# Patient Record
Sex: Male | Born: 1990 | Hispanic: Yes | Marital: Single | Smoking: Former smoker
Health system: Southern US, Community
[De-identification: ages and names within clinical notes are randomized; demographics above are authoritative.]

## PROBLEM LIST (undated history)

## (undated) HISTORY — PX: EYE SURGERY: SHX253

---

## 2012-06-06 ENCOUNTER — Emergency Department (HOSPITAL_COMMUNITY)
Admission: EM | Admit: 2012-06-06 | Discharge: 2012-06-06 | Disposition: A | Discharge status: Home / Self Care | Payer: Self-pay | Attending: Emergency Medicine | Admitting: Emergency Medicine

## 2012-06-06 ENCOUNTER — Encounter (HOSPITAL_COMMUNITY): Payer: Self-pay | Admitting: Family Medicine

## 2012-06-06 DIAGNOSIS — L723 Sebaceous cyst: Secondary | ICD-10-CM | POA: Insufficient documentation

## 2012-06-06 NOTE — ED Notes (Signed)
Pt has possible abscess under right arm. sts red and swollen. Hx of same a few years ago.

## 2012-06-06 NOTE — ED Provider Notes (Signed)
History     CSN: [id number]  Arrival date & time 06/06/12  1191   First MD Initiated Contact with Patient 06/06/12 2005      Chief Complaint  Patient presents with  . Abscess   HPI  History provided by the patient and friend. Patient is a 22 year old Hispanic male who presents with pain and swelling of the right axilla. Patient reports having similar symptoms in the past after an insect bite. Patient had an I&D performed in symptoms seem to be improved but she states she has always had a persistent small not to the area. This has never caused pain or discomfort. Last night he began having pain and redness to the area with increased swelling. Pain is worse with movements or palpation. Patient is not using treatment for his symptoms. He denies any other aggravating or alleviating factors. Denies any other associated symptoms. No fever, chills or sweats the      History reviewed. No pertinent past medical history.  History reviewed. No pertinent past surgical history.  History reviewed. No pertinent family history.  History  Substance Use Topics  . Smoking status: Never Smoker   . Smokeless tobacco: Not on file  . Alcohol Use: No      Review of Systems  Constitutional: Negative for fever and chills.  All other systems reviewed and are negative.    Allergies  Review of patient's allergies indicates no known allergies.  Home Medications  No current outpatient prescriptions on file.  BP 128/81  Pulse 69  Temp 98.1 F (36.7 C) (Oral)  Resp 18  SpO2 100%  Physical Exam  Nursing note and vitals reviewed. Constitutional: He is oriented to person, place, and time. He appears well-developed and well-nourished. No distress.  HENT:  Head: Normocephalic.  Cardiovascular: Normal rate and regular rhythm.   Pulmonary/Chest: Effort normal and breath sounds normal.  Neurological: He is alert and oriented to person, place, and time.  Skin: Skin is warm.       2-3 cm tender  nodule to the right axilla with erythema of the skin. There is some fluctuance. Mild induration of the skin.  Psychiatric: He has a normal mood and affect. His behavior is normal.    ED Course  Procedures  INCISION AND DRAINAGE Performed by: Angus Seller Consent: Verbal consent obtained. Risks and benefits: risks, benefits and alternatives were discussed Type: abscess. Bedside ultrasound was used for evaluation.   Body area: Right axilla  Anesthesia: local infiltration  Incision was made with a scalpel.  Local anesthetic: lidocaine 2% with epinephrine  Anesthetic total: 3 ml  Complexity: complex Blunt dissection to break up loculations.after incision was found patient had an infected sebaceous cyst. The external capsule was fragile and most of it was removed with blunt dissection.   Drainage: purulent  Drainage amount: Small   Packing material: None   Patient tolerance: Patient tolerated the procedure well with no immediate complications.        1. Infected sebaceous cyst       MDM  8:45 PM patient seen and evaluated. Patient appears well in no acute distress. He does not appear severely ill or toxic.        Angus Seller, [place] 06/06/12 2128

## 2012-06-06 NOTE — ED Provider Notes (Signed)
Medical screening examination/treatment/procedure(s) were performed by non-physician practitioner and as supervising physician I was immediately available for consultation/collaboration.  Juliet Rude. Rubin Payor, MD 06/06/12 2351

## 2012-06-06 NOTE — ED Notes (Signed)
PA at bedside.

## 2012-09-01 ENCOUNTER — Emergency Department (HOSPITAL_COMMUNITY): Payer: Self-pay

## 2012-09-01 ENCOUNTER — Emergency Department (HOSPITAL_COMMUNITY)
Admission: EM | Admit: 2012-09-01 | Discharge: 2012-09-02 | Disposition: A | Discharge status: Home / Self Care | Payer: Self-pay | Attending: Emergency Medicine | Admitting: Emergency Medicine

## 2012-09-01 ENCOUNTER — Encounter (HOSPITAL_COMMUNITY): Payer: Self-pay | Admitting: Emergency Medicine

## 2012-09-01 DIAGNOSIS — Z23 Encounter for immunization: Secondary | ICD-10-CM | POA: Insufficient documentation

## 2012-09-01 DIAGNOSIS — W268XXA Contact with other sharp object(s), not elsewhere classified, initial encounter: Secondary | ICD-10-CM | POA: Insufficient documentation

## 2012-09-01 DIAGNOSIS — Y990 Civilian activity done for income or pay: Secondary | ICD-10-CM | POA: Insufficient documentation

## 2012-09-01 DIAGNOSIS — Y939 Activity, unspecified: Secondary | ICD-10-CM | POA: Insufficient documentation

## 2012-09-01 DIAGNOSIS — S91331A Puncture wound without foreign body, right foot, initial encounter: Secondary | ICD-10-CM

## 2012-09-01 DIAGNOSIS — F17200 Nicotine dependence, unspecified, uncomplicated: Secondary | ICD-10-CM | POA: Insufficient documentation

## 2012-09-01 DIAGNOSIS — Y9269 Other specified industrial and construction area as the place of occurrence of the external cause: Secondary | ICD-10-CM | POA: Insufficient documentation

## 2012-09-01 DIAGNOSIS — S91309A Unspecified open wound, unspecified foot, initial encounter: Secondary | ICD-10-CM | POA: Insufficient documentation

## 2012-09-01 MED ORDER — TETANUS-DIPHTH-ACELL PERTUSSIS 5-2.5-18.5 LF-MCG/0.5 IM SUSP
0.5000 mL | Freq: Once | INTRAMUSCULAR | Status: AC
  Administered 2012-09-02: 0.5 mL via INTRAMUSCULAR
  Filled 2012-09-01: qty 0.5

## 2012-09-01 NOTE — ED Notes (Signed)
PT. ACCIDENTALLY STEPPED ON A NAIL AT RIGHT FOOT THIS AFTERNOON . NO TETANUS IMMUNIZATION . NO BLEEDING .

## 2012-09-01 NOTE — ED Provider Notes (Signed)
History    This chart was scribed for non-physician practitioner working with Olivia Mackie, MD by Leone Payor, ED Scribe. This patient was seen in room TR08C/TR08C and the patient's care was started at 2113.   CSN: [id number]  Arrival date & time 09/01/12  2113   None     Chief Complaint  Patient presents with  . Foot Injury     The history is provided by the patient. No language interpreter was used.    Frederick Caldwell is a 22 y.o. male who presents to the Emergency Department complaining of a new puncture wound to the R foot after stepping on a nail. Pt's tetanus is not UTD. He does not have any bleeding currently. Pt was at work when the injury occurred through the shoe.   Pt is a current everyday smoker and occasional alcohol user.  History reviewed. No pertinent past medical history.  History reviewed. No pertinent past surgical history.  No family history on file.  History  Substance Use Topics  . Smoking status: Current Every Day Smoker  . Smokeless tobacco: Not on file  . Alcohol Use: Yes      Review of Systems  Constitutional: Negative for fever.  Respiratory: Negative for shortness of breath.   Skin: Positive for wound. Negative for rash.    Allergies  Review of patient's allergies indicates no known allergies.  Home Medications  No current outpatient prescriptions on file.  BP 108/58  Pulse 81  Temp(Src) 98.4 F (36.9 C) (Oral)  Resp 14  SpO2 99%  Physical Exam  Nursing note and vitals reviewed. Constitutional: He appears well-developed and well-nourished. No distress.  HENT:  Head: Normocephalic and atraumatic.  Eyes: Conjunctivae and EOM are normal.  Neck: Normal range of motion. Neck supple.  Cardiovascular:  Intact distal pulses, capillary refill < 3 seconds  Musculoskeletal:  All other extremities with normal ROM  Neurological:  No sensory deficit  Skin: He is not diaphoretic.  No palpable foreign body.  Small puncture wound  not currently bleeding located on plantar surface of right foot    ED Course  Procedures (including critical care time)  DIAGNOSTIC STUDIES: Oxygen Saturation is 99% on room air, normal by my interpretation.    COORDINATION OF CARE: 11:57 PM-Discussed treatment plan with pt at bedside and pt agreed to plan.  Pt is informed that the pain will get better but then will worsen after 2 days. Pt is advised to come back if there is swelling, drainage of pus, redness/red stripes, warmth, fever.   Labs Reviewed - No data to display Dg Foot Complete Right  09/01/2012  *RADIOLOGY REPORT*  Clinical Data: Pain post trauma  RIGHT FOOT COMPLETE - 3+ VIEW  Comparison: None.  Findings:  Frontal, oblique, lateral views were obtained.  There is no fracture or dislocation.  There is a small erosion in the first MTP joint medially.  Joint spaces elsewhere appear intact.  No erosive change.  No radiopaque foreign body.  IMPRESSION: Small erosion along the medial aspect of the first MTP joint.  No other abnormality noted.   Original Report Authenticated By: Bretta Bang, M.D.      No diagnosis found.    MDM  Puncture wound  Patient presents to the ER for a puncture wound of his foot. Reports  a nail going through his shoe. Bleeding contained. Neurovascularly intact & no foreign bodies on exam. Imaging reviewed, no signs of bone or joint penetration. Tetanus was not up-to-date so  given in the emergency department.  Patient will be discharged on antibiotic with pseudomonas coverage.  Strict return precautions for signs of infection discussed. Advised non-wt bearing, elevated & very close follow up for signs of infection.  Patient verbalizes understanding.     I personally performed the services described in this documentation, which was scribed in my presence. The recorded information has been reviewed and is accurate.     Jaci Carrel, [place] 09/02/12 [phone]

## 2012-09-02 MED ORDER — HYDROCODONE-ACETAMINOPHEN 5-325 MG PO TABS: 1.0000 | ORAL_TABLET | Freq: Four times a day (QID) | ORAL | Status: DC | PRN

## 2012-09-02 MED ORDER — AMOXICILLIN-POT CLAVULANATE 875-125 MG PO TABS: 1.0000 | ORAL_TABLET | Freq: Two times a day (BID) | ORAL | Status: DC

## 2012-09-02 NOTE — ED Provider Notes (Signed)
Medical screening examination/treatment/procedure(s) were performed by non-physician practitioner and as supervising physician I was immediately available for consultation/collaboration.  Mackenzie Lia M Zondra Lawlor, MD 09/02/12 0646 

## 2014-05-01 ENCOUNTER — Encounter (HOSPITAL_COMMUNITY): Payer: Self-pay | Admitting: Emergency Medicine

## 2014-05-01 ENCOUNTER — Emergency Department (HOSPITAL_COMMUNITY)
Admission: EM | Admit: 2014-05-01 | Discharge: 2014-05-01 | Disposition: A | Discharge status: Home / Self Care | Payer: Self-pay | Attending: Emergency Medicine | Admitting: Emergency Medicine

## 2014-05-01 ENCOUNTER — Emergency Department (HOSPITAL_COMMUNITY): Payer: Self-pay

## 2014-05-01 DIAGNOSIS — Z79899 Other long term (current) drug therapy: Secondary | ICD-10-CM | POA: Insufficient documentation

## 2014-05-01 DIAGNOSIS — J069 Acute upper respiratory infection, unspecified: Secondary | ICD-10-CM | POA: Insufficient documentation

## 2014-05-01 DIAGNOSIS — Z87891 Personal history of nicotine dependence: Secondary | ICD-10-CM | POA: Insufficient documentation

## 2014-05-01 DIAGNOSIS — R059 Cough, unspecified: Secondary | ICD-10-CM

## 2014-05-01 DIAGNOSIS — R05 Cough: Secondary | ICD-10-CM

## 2014-05-01 DIAGNOSIS — Z792 Long term (current) use of antibiotics: Secondary | ICD-10-CM | POA: Insufficient documentation

## 2014-05-01 MED ORDER — GUAIFENESIN 100 MG/5ML PO SOLN: 5.0000 mL | ORAL | Status: DC | PRN

## 2014-05-01 MED ORDER — GUAIFENESIN-CODEINE 100-10 MG/5ML PO SOLN: 5.0000 mL | Freq: Three times a day (TID) | ORAL | Status: DC | PRN

## 2014-05-01 NOTE — ED Provider Notes (Signed)
CSN: [id number]     Arrival date & time 05/01/14  [id number] History  This chart was scribed for Santiago GladHeather Masen Salvas, PA-C, working with Audree CamelScott T Goldston, MD found by Elon SpannerGarrett Cook, ED Scribe. This patient was seen in room WTR9/WTR9 and the patient's care was started at 7:34 PM.   Chief Complaint  Patient presents with  . Cough   The history is provided by the patient. No language interpreter was used.    HPI Comments: Frederick Caldwell is a 23 y.o. male who presents to the Emergency Department complaining of a cough productive of yellowish sputum onset 6 weeks ago.  He reports the cough causes CP.  However, he does not have any chest pain at rest when he is not coughing.  Patient reports associated intermittent SOB and nasal congestion.  Patient has tried Mucinex, Zyrtec, and Claritin without relief.  Patient denies a history of smoking.   Patient denies sinus congestion, ear pain, fever, or chills.  History reviewed. No pertinent past medical history. Past Surgical History  Procedure Laterality Date  . Eye surgery     Family History  Problem Relation Age of Onset  . Blindness Mother   . Down syndrome Sister    History  Substance Use Topics  . Smoking status: Former Smoker    Quit date: 03/18/2014  . Smokeless tobacco: Not on file  . Alcohol Use: Yes     Comment: social    Review of Systems  Constitutional: Negative for fever.  HENT: Negative for congestion and ear pain.   Respiratory: Positive for cough.   Cardiovascular: Positive for chest pain.  All other systems reviewed and are negative.     Allergies  Review of patient's allergies indicates no known allergies.  Home Medications   Prior to Admission medications   Medication Sig Start Date End Date Taking? Authorizing Provider  amoxicillin-clavulanate (AUGMENTIN) 875-125 MG per tablet Take 1 tablet by mouth 2 (two) times daily. One po bid x 7 days 09/02/12   Jaci CarrelLisette Paz, PA-C  HYDROcodone-acetaminophen (NORCO/VICODIN)  5-325 MG per tablet Take 1 tablet by mouth every 6 (six) hours as needed for pain. 09/02/12   Lisette Paz, PA-C  Multiple Vitamin (MULTIVITAMIN WITH MINERALS) TABS Take 1 tablet by mouth daily.    Historical Provider, MD   BP 123/68 mmHg  Pulse 68  Temp(Src) 97.9 F (36.6 C) (Oral)  Resp 16  SpO2 100% Physical Exam  Constitutional: He is oriented to person, place, and time. He appears well-developed and well-nourished. No distress.  HENT:  Head: Normocephalic and atraumatic.  Mouth/Throat: Oropharynx is clear and moist.  TM's normal  Eyes: Conjunctivae and EOM are normal.  Neck: Neck supple. No tracheal deviation present.  Cardiovascular: Normal rate, regular rhythm and normal heart sounds.  Exam reveals no gallop and no friction rub.   No murmur heard. Pulmonary/Chest: Effort normal and breath sounds normal. No respiratory distress. He has no wheezes. He has no rales.  Musculoskeletal: Normal range of motion.  Neurological: He is alert and oriented to person, place, and time.  Skin: Skin is warm and dry.  Psychiatric: He has a normal mood and affect. His behavior is normal.  Nursing note and vitals reviewed.   ED Course  Procedures (including critical care time)  DIAGNOSTIC STUDIES: Oxygen Saturation is 100% on RA, normal by my interpretation.    COORDINATION OF CARE:  7:38 PM Will order CXR to r/o pneumonia.  Patient acknowledges and agrees with plan.    Labs  Review Labs Reviewed - No data to display  Imaging Review No results found.   EKG Interpretation None      MDM   Final diagnoses:  None   Pt CXR negative for acute infiltrate. Patients symptoms are consistent with URI, likely viral etiology. Discussed that antibiotics are not indicated for viral infections. Pt will be discharged with symptomatic treatment.  Verbalizes understanding and is agreeable with plan. Pt is hemodynamically stable & in NAD prior to dc.  Return precautions given.     Santiago GladHeather  Blakelee Allington, PA-C 05/04/14 [phone]  Audree CamelScott T Goldston, MD 05/04/14 [phone]

## 2014-05-01 NOTE — Discharge Instructions (Signed)

## 2014-05-01 NOTE — ED Notes (Signed)
Family states pt has had a cough for over a month and cannot get rid of it with OTC medication  Pt states for the past 2 days he is c/o chest soreness with cough  Pt states for the past few days the cough is productive with light yellow sputum with occasional greenish color

## 2014-05-13 ENCOUNTER — Emergency Department (HOSPITAL_COMMUNITY)
Admission: EM | Admit: 2014-05-13 | Discharge: 2014-05-13 | Disposition: A | Discharge status: Home / Self Care | Payer: Self-pay

## 2014-05-13 ENCOUNTER — Encounter (HOSPITAL_COMMUNITY): Payer: Self-pay | Admitting: Emergency Medicine

## 2014-05-13 ENCOUNTER — Emergency Department (HOSPITAL_COMMUNITY)
Admission: EM | Admit: 2014-05-13 | Discharge: 2014-05-13 | Disposition: A | Discharge status: Home / Self Care | Payer: Self-pay | Attending: Emergency Medicine | Admitting: Emergency Medicine

## 2014-05-13 DIAGNOSIS — Z79899 Other long term (current) drug therapy: Secondary | ICD-10-CM | POA: Insufficient documentation

## 2014-05-13 DIAGNOSIS — Y998 Other external cause status: Secondary | ICD-10-CM | POA: Insufficient documentation

## 2014-05-13 DIAGNOSIS — L309 Dermatitis, unspecified: Secondary | ICD-10-CM | POA: Insufficient documentation

## 2014-05-13 DIAGNOSIS — Z792 Long term (current) use of antibiotics: Secondary | ICD-10-CM | POA: Insufficient documentation

## 2014-05-13 DIAGNOSIS — Y9289 Other specified places as the place of occurrence of the external cause: Secondary | ICD-10-CM | POA: Insufficient documentation

## 2014-05-13 DIAGNOSIS — Z87891 Personal history of nicotine dependence: Secondary | ICD-10-CM | POA: Insufficient documentation

## 2014-05-13 DIAGNOSIS — S39012A Strain of muscle, fascia and tendon of lower back, initial encounter: Secondary | ICD-10-CM | POA: Insufficient documentation

## 2014-05-13 DIAGNOSIS — Z7952 Long term (current) use of systemic steroids: Secondary | ICD-10-CM | POA: Insufficient documentation

## 2014-05-13 DIAGNOSIS — Y9389 Activity, other specified: Secondary | ICD-10-CM | POA: Insufficient documentation

## 2014-05-13 DIAGNOSIS — X58XXXA Exposure to other specified factors, initial encounter: Secondary | ICD-10-CM | POA: Insufficient documentation

## 2014-05-13 MED ORDER — HYDROCORTISONE 1 % EX CREA: TOPICAL_CREAM | CUTANEOUS | Status: DC

## 2014-05-13 MED ORDER — TRAMADOL HCL 50 MG PO TABS: 50.0000 mg | ORAL_TABLET | Freq: Four times a day (QID) | ORAL | Status: DC | PRN

## 2014-05-13 MED ORDER — CYCLOBENZAPRINE HCL 10 MG PO TABS: 10.0000 mg | ORAL_TABLET | Freq: Two times a day (BID) | ORAL | Status: DC | PRN

## 2014-05-13 NOTE — ED Notes (Signed)
Pt's wife states that patient also has rash on left leg.

## 2014-05-13 NOTE — ED Notes (Signed)
Pt called twice and did not answer

## 2014-05-13 NOTE — ED Notes (Signed)
No answer when called 

## 2014-05-13 NOTE — ED Notes (Signed)
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

## 2014-05-13 NOTE — ED Provider Notes (Signed)
CSN: [id number]     Arrival date & time 05/13/14  1711 History   This chart was scribed for non-physician practitioner Marlon Peliffany Daya Dutt, PA-C working Mirian MoMatthew Gentry, MD by Evon Slackerrance Branch, ED Scribe. This patient was seen in room WTR7/WTR7 and the patient's care was started at 7:11 PM.    Chief Complaint  Patient presents with  . Back Pain    lower  . Shoulder Pain    right  . Rash    leg   The history is provided by the patient. No language interpreter was used.   HPI Comments: Frederick Caldwell is a 23 y.o. male who presents to the Emergency Department complaining of right shoulder pain onset 1 day prior. Pt is also complaining of back pain as well. 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 PT denies taking any medications PTA. Pt denies gait problem, urinary symptoms   History reviewed. No pertinent past medical history. Past Surgical History  Procedure Laterality Date  . Eye surgery     Family History  Problem Relation Age of Onset  . Blindness Mother   . Down syndrome Sister    History  Substance Use Topics  . Smoking status: Former Smoker    Quit date: 03/18/2014  . Smokeless tobacco: Not on file  . Alcohol Use: Yes     Comment: social    Review of Systems  Musculoskeletal: Positive for back pain.  All other systems reviewed and are negative.     Allergies  Review of patient's allergies indicates no known allergies.  Home Medications   Prior to Admission medications   Medication Sig Start Date End Date Taking? Authorizing Provider  amoxicillin-clavulanate (AUGMENTIN) 875-125 MG per tablet Take 1 tablet by mouth 2 (two) times daily. One po bid x 7 days Patient not taking: Reported on 05/13/2014 09/02/12   Lisette Paz, PA-C  cyclobenzaprine (FLEXERIL) 10 MG tablet Take 1 tablet (10 mg total)  by mouth 2 (two) times daily as needed for muscle spasms. 05/13/14   Jo-Ann Johanning Irine SealG Avonna Iribe, PA-C  guaiFENesin-codeine 100-10 MG/5ML syrup Take 5 mLs by mouth 3 (three) times daily as needed for cough. Patient not taking: Reported on 05/13/2014 05/01/14   Santiago GladHeather Laisure, PA-C  HYDROcodone-acetaminophen (NORCO/VICODIN) 5-325 MG per tablet Take 1 tablet by mouth every 6 (six) hours as needed for pain. Patient not taking: Reported on 05/13/2014 09/02/12   Jaci CarrelLisette Paz, PA-C  hydrocortisone cream 1 % Apply to affected area 2 times daily 05/13/14   Dorthula Matasiffany G Deborha Moseley, PA-C  traMADol (ULTRAM) 50 MG tablet Take 1 tablet (50 mg total) by mouth every 6 (six) hours as needed. 05/13/14   Dorthula Matasiffany G Areebah Meinders, PA-C   Triage Vitals: BP 117/68 mmHg  Pulse 69  Temp(Src) 97.9 F (36.6 C) (Oral)  Resp 17  SpO2 100%  Physical Exam  Constitutional: He is oriented to person, place, and time. He appears well-developed and well-nourished. No distress.  HENT:  Head: Normocephalic and atraumatic.  Eyes: Conjunctivae and EOM are normal.  Neck: Neck supple. No tracheal deviation present.  Cardiovascular: Normal rate.   Pulmonary/Chest: Effort normal. No respiratory distress.  Musculoskeletal: Normal range of motion.       Back:  Pt has equal strength to bilateral lower extremities.  Neurosensory function adequate to both legs  No clonus on dorsiflextion Skin color is normal. Skin is warm and moist.  I see no step off deformity, no midline bony tenderness.  Pt is able to ambulate.  No crepitus, laceration, effusion, induration, lesions, swelling.   Pedal pulses are symmetrical and palpable bilaterally  Bilateral lower tenderness to palpation of paraspinel muscles   Neurological: He is alert and oriented to person, place, and time.  Skin: Skin is warm and dry.  3x4 area of eczema to the right shoulder.   Psychiatric: He has a normal mood and affect. His behavior is normal.  Nursing note and vitals reviewed.   ED  Course  Procedures (including critical care time) DIAGNOSTIC STUDIES: Oxygen Saturation is 100% on RA, normal by my interpretation.    COORDINATION OF CARE:    Labs Review Labs Reviewed - No data to display  Imaging Review No results found.   EKG Interpretation None      MDM   Final diagnoses:  Eczema  Low back strain, initial encounter   23 y.o.Frederick Caldwell's  with back pain. No neurological deficits and normal neuro exam. Patient can walk. No loss of bowel or bladder control. No concern for cauda equina at this time base on HPI and physical exam findings. No fever, night sweats, weight loss, h/o cancer, IVDU.   RICE protocol and pain medicine indicated and discussed with patient.   Patient Plan 1. Medications: pain medication and muscle relaxer. Cont usual home medications unless otherwise directed. 2. Treatment: rest, drink plenty of fluids, gentle stretching as discussed, alternate ice and heat  3. Follow Up: Please followup with your primary doctor for discussion of your diagnoses and further evaluation after today's visit; if you do not have a primary care doctor use the resource guide provided to find one  Advised to follow-up with the orthopedist if symptoms do not start to resolve in the next 2-3 days. If develop loss of bowel or urinary control return to the ED as soon as possible for further evaluation. To take the medications as prescribed as they can cause harm if not taken appropriately.   Vital signs are stable at discharge. Filed Vitals:   05/13/14 1724  BP: 117/68  Pulse: 69  Temp: 97.9 F (36.6 C)  Resp: 17    Patient/guardian has voiced understanding and agreed to follow-up with the PCP or specialist.   I personally performed the services described in this documentation, which was scribed in my presence. The recorded information has been reviewed and is accurate.       Dorthula Matasiffany G Sharelle Burditt, PA-C 05/13/14 1952  Mirian MoMatthew Gentry,  MD 05/20/14 [phone]

## 2014-05-13 NOTE — Discharge Instructions (Signed)
Traumatismos acromioclaviculares (Acromioclavicular Injuries) La articulacin AC (acromioclavicular) es la articulacin del hombro en donde la clavcula se une con el omplato (escpula). La parte del omplato que se conecta a la clavcula se denomina acromion. Es por esto que se denomina articulacin AC (acromio-clavicular). Abajo se detallan los problemas ms comunes y los tratamientos para la articulacin AC. ARTRITIS La artritis se produce cuando la articulacin se ha lesionado y Charity fundraiser (la almohadilla blanda que se encuentra entre las articulaciones) se destruye. Este es el desgaste que se observa en la mayor parte de las articulaciones del organismo si han sido usadas intensamente y [place]. Esto hace que la articulacin duela y se hinche, lo que empeora con el uso. SEPARACIN DE LA ARTICULACIN AC La separacin de la articulacin AC implica que los ligamentos que conectan el acromion con el omplato y la clavcula han sido daados, y ambos huesos no estn alineados. La separacin [employer] puede ser [employer] grave, y el "grado" depende de si el ligamento se ha desgarrado y Barista de su deterioro.  [place] I - La lesin es la ms leve, y la articulacin AC est en Chief of Staff.  [place] II - Se produce una lesin en los ligamentos que refuerzan la articulacin AC. En este tipo de lesin, los ligamentos estn slo distendidos y no completamente desgarrados. Cuando se tensiona, la articulacin AC se vuelve dolorosa e inestable.  Ovid articulacin AC y los ligamentos secundarios estn completamente desgarrados y la clavcula ya no est unida al omplato. Esto produce una deformidad. FRACTURA DE LA ARTICULACIN AC La fractura de la articulacin AC significa que hubo una ruptura de los huesos de esa articulacin. TRATAMIENTO TRATAMIENTO DE LA ARTRITIS AC  Actualmente no hay forma de reemplazar el cartlago lesionado por la artritis. La mejor forma de  mejorar el trastorno es disminuir las actividades que agravan el problema. La aplicacin de hielo en las articulaciones ayuda a Therapist, occupational y la inflamacin [place]).  Si las medidas menos conservadoras no dan resultado, se pueden Risk manager las inyecciones de corticoides. Estos son antiinflamatorios. Disminuyen el dolor y la hinchazn en la articulacin.  Si las medidas no quirrgicas fracasan, se puede [employer]. El tratamiento puede consistir en la remocin de la porcin final de la clavcula. Esta es la parte de la clavcula ms cercana al hombro que habitualmente est fijada con ligamentos al acromion del omplato. Esta ciruga puede realizarse mediante artroscopa (un instrumento similar a un lpiz telescpico para Chemical engineer interior de Water engineer) o puede realizarse como ciruga abierta a travs de una pequea incisin (corte realizado por un cirujano). La mayora de los pacientes tendr una buena amplitud de movimientos en seis semanas, y Geologist, engineering a Data processing manager en alrededor de 12 semanas, salvo que existan complicaciones. TRATAMIENTO DE LA SEPARACIN AC  El tratramiento inicial se realiza para Therapist, occupational. Esto se logra inmovilizando el brazo en un cabestrillo y colocando una bolsa con hielo en el hombro durante 30 a 30 minutos cada dos horas segn la necesidad. A medida que el dolor disminuye, es importante comenzar a [employer] dedos, la [place], el codo y finalmente el hombro para prevenir la rigidez o "congelamiento" del hombro. Las instrucciones acerca de cundo y Chiropractor hombro se las dar el profesional que lo asiste. La cantidad de [employer] se necesita para recuperar todo el movimiento y las funciones depende del tamao o el Clarita de  la lesin. La recuperacin de una separacin AC de [place] I, generalmente demora entre 10 y [address], mientras que la de [place] III puede llevarle entre seis y Public house manager, lo mismo que demora la curacin de un hueso  fracturado.  [place] I y II generalmente no requieren [place]. Frederica III permiten regresar totalmente a las actividades con Lawyer. El tratamiento tambin depende de la profesin del [place]. Por ejemplo un jugador de ftbol de alto nivel con una lesin en el brazo que lanza, recibir un tratamiento ms enrgico que alguien que trabaja en un escritorio y que rara vez utiliza el brazo para actividades intensas. En algunos casos, puede presentarse un bulto doloroso que persiste y que puede requerir [place] en una etapa posterior. La ciruga puede ser muy exitosa, pero los beneficios deben evaluarse teniendo en cuenta los riesgos potenciales. TRATAMIENTO DE LA FRACTURA AC El tratamiento de fractura depende del tipo de fractura. En algunos casos, una tablilla o un cabestrillo ser todo lo que necesite. Otras veces puede ser necesaria la [place]. El profesional que lo asiste lo ayudar con estas decisiones y podr decidir junto a usted cul ser el mejor tratamiento para usted. INSTRUCCIONES PARA EL CUIDADO DOMICILIARIO  Aplique hielo sobre la lesin durante 15 a 20 minutos por hora mientras se encuentre despierto, durante 2 das. Ponga el hielo en una bolsa plstica y coloque una toalla entre la bolsa y la piel.  Si le han colocado un cabestrillo, selo durante el tiempo que se lo indique su mdico, incluso de noche. El cabestrillo o la tablilla podrn retirarse para darse un bao o una ducha, o segn las indicaciones. Asegrese de Radio producer, tal como cuando le colocaron el cabestrillo. No levante el brazo.  Si le han colocado un cabestrillo en ocho, debe hacer que otra persona se lo ajuste suavemente [employer]. Ajstela lo suficiente como para [employer] hombros contenidos. Deje el espacio suficiente como para que pueda introducir el dedo ndice entre el cuerpo y Geneticist, molecular. Afljelo inmediatamente si siente  adormecimiento u hormigueo en las manos.  Utilice los medicamentos de venta libre o de prescripcin para Conservation officer, historic buildings, Health and safety inspector o la [place], segn se lo indique el profesional que lo asiste.  Si a usted o a Oceanographer han dado turno para Health visitor, es muy importante que concurra para Product/process development scientist las complicaciones a largo plazo, el dolor crnico o la discapacidad. SOLICITE ATENCIN MDICA SI:  El dolor no se alivia con los [employer].  Aumenta la hinchazn o hay una decoloracin que Engineering geologist de Teacher, English as a foreign language.  Por algn motivo usted o su nio no pudieron Advertising account executive segn las indicaciones.  Hay un adormecimiento y hormigueo progresivos en el brazo, Management consultant o la [place]. SOLICITE ATENCIN MDICA DE INMEDIATO SI:  Siente el brazo adormecido, fro o plido.  Hay un aumento del [employer] mano, el antebrazo o los dedos. EST SEGURO QUE:  Comprende las instrucciones para el alta mdica.  Controlar su enfermedad.  Solicitar atencin mdica de inmediato segn las indicaciones. Document Released: 02/12/2005 Document Revised: 07/28/2011 [facility] Patient Information 2015 [place]. This information is not intended to replace advice given to you by your health care provider. Make sure you discuss any questions you have with your health care provider.   Dolor de espalda en el adulto (Back Pain, Adult)  El dolor de cintura es frecuente. Aproximadamente 1  de cada 5 personas lo sufren.La causa rara vez pone en peligro la vida. Con frecuencia mejora luego de algn tiempo.Alrededor de la mitad de las personas que sufren un inicio sbito de dolor de cintura, se sentirn mejor luego de 2 semanas. Aproximadamente 8 de cada 10 se sentirn mejor luego de 6 semanas.  CAUSAS  Algunas causas comunes son:   Distensin de los msculos o ligamentos que sostienen la columna vertebral.  Desgaste (degeneracin) de los discos vertebrales.  Artritis.  Traumatismos directos en  la espalda. DIAGNSTICO  La mayor parte de las veces, la causa directa no se conoce.Sin embargo, Conservation officer, historic buildings puede tratarse efectivamente an cuando no se Community education officer.Una de las formas ms precisas de asegurar que la causa del dolor no constituye un peligro es responder a las preguntas del mdico acerca de su salud y sus sntomas. Si el mdico necesita ms informacin, podr indicar anlisis de laboratorio o Optometrist un diagnstico por imgenes (radiografas o Health visitor).Sin embargo, aunque las [employer] modificaciones, generalmente no es necesaria la [place].  INSTRUCCIONES PARA EL CUIDADO EN EL HOGAR  En algunas personas, el dolor de espalda vuelve.Como rara vez es peligroso, los pacientes pueden aprender a Education administrator.   Mantngase activo. Si permanece sentado o de pie mucho tiempo en el mismo lugar, se tensiona la espalda.  No se siente, maneje ni se quede parado en un mismo lugar por ms de 30 minutos. Realice caminatas cortas en superficies planas ni bien el dolor haya cedido. Trate de Orthoptist tiempo que camina .  No se quede en la cama.Si hace reposo durante ms de 1 o 2 das, puede Geologist, engineering.  No evite los ejercicios ni el trabajo.El cuerpo est hecho para moverse.No es peligroso estar [place], aunque le duela la espalda.La espalda se curar ms rpido si contina sus actividades antes de que el dolor se vaya.  Preste atencin a su cuerpo cuando se incline y se levante. Muchas personas sienten menos molestias cuando levantan objetos si doblan las rodillas, mantienen la carga cerca del cuerpo y evitan torcerse. Generalmente, las posiciones ms cmodas son las que ejercen menos tensin en la espalda en recuperacin.  Encuentre una posicin cmoda para dormir. Utilice un colchn firme y recustese de [place]. Doble ligeramente sus rodillas. Si se recuesta sobre su espalda, coloque una almohada debajo de sus rodillas.  Tome slo  medicamentos de venta libre o recetados, segn las indicaciones del mdico. Los medicamentos de venta libre para Glass blower/designer y reducir Futures trader, son los que en general ms ayudan.El mdico podr prescribirle relajantes musculares.Estos medicamentos calman el dolor de modo que pueda retornar a sus actividades normales y a Marine scientist.  Aplique hielo sobre la zona lesionada.  Ponga el hielo en una bolsa plstica.  Colquese una toalla entre la piel y la bolsa de hielo.  Deje la bolsa de hielo durante 15 a 20 minutos 3 a 4 veces por da, durante los primeros 2  3 das. Luego podr alternar Lyndal Pulley calor y 22 para reducir Conservation officer, historic buildings y los espasmos.  Consulte a su mdico si puede tratar de hacer ejercicios para la espalda y recibir un masaje suave. Pueden ser beneficiosos.  Evite sentirse ansioso o estresado.El estrs aumenta la tensin muscular y puede empeorar el dolor de espalda.Es importante reconocer cuando est ansioso o estresado y aprender la forma de controlarlos.El ejercicio es una gran opcin. SOLICITE ATENCIN MDICA SI:   Siente un dolor que  no se alivia con reposo o medicamentos.  El dolor no mejora en 1 semana.  Desarrolla nuevos sntomas.  No se siente bien en general. SOLICITE ATENCIN MDICA DE INMEDIATO SI:  Siente un dolor que se irradia desde la espalda hacia sus piernas.  Desarrolla nuevos problemas en el intestino o la vejiga.  Siente debilidad o adormecimiento inusual en sus brazos o piernas.  Presenta nuseas o vmitos.  Presenta dolor abdominal.  Se siente desfalleciente. Document Released: 05/05/2005 Document Revised: 11/04/2011 [facility] Patient Information 2015 [place], [place]. This information is not intended to replace advice given to you by your health care provider. Make sure you discuss any questions you have with your health care provider.  Dermatitis de contacto (Contact Dermatitis) La dermatitis de contacto es  una reaccin a ciertas sustancias que tocan la piel. Puede ser Ardelia Mems dermatitis de contacto irritante o alrgica. La dermatitis de contacto irritante no requiere exposicin previa a la sustancia que provoc la reaccin.La dermatitis alrgica slo ocurre si ha estado expuesto anteriormente a la sustancia. Al repetir la exposicin, el organismo reacciona a la sustancia.  CAUSAS  Muchas sustancias pueden causar dermatitis de contacto. La dermatitis irritante se produce cuando hay exposicin repetida a sustancias levemente irritantes, como por ejemplo:   Maquillaje.  Jabones.  Detergentes.  Lavandina.  cidos.  Sales metlicas, como el nquel. Las causas de la dermatitis alrgica son:   Plantas venenosas.  Sustancias qumicas (desodorantes, champs).  Bijouterie.  Ltex.  Neomicina en cremas con triple antibitico.  Conservantes en productos incluyendo en la ropa. SNTOMAS  En la zona de la piel que ha estado expuesta puede haber:   Sequedad o descamacin.  Enrojecimiento.  Grietas.  Picazn.  Dolor o sensacin de ardor.  Ampollas. En el caso de la dermatitis de Risk manager, puede haber slo hinchazn en algunas zonas, como la boca o los genitales.  DIAGNSTICO  El mdico podr hacer el diagnstico realizando un examen fsico. En los casos en que la causa es incierta y se sospecha una dermatitis de [place], le har una prueba en la piel con un parche para determinar la causa de la dermatitis. TRATAMIENTO  El tratamiento incluye la proteccin de la piel de nuevos contactos con la sustancia irritante, evitando la sustancia en lo posible. Puede ser de utilidad colocar una barrera como cremas, polvos y [place]. El mdico tambin podr recomendar:   Cremas o pomadas con corticoides aplicadas 2 veces por da. Para un mejor efecto, humedezca la zona con agua fresca durante 20 minutos. Luego aplique el medicamento. Cubra la zona con un vendaje plstico. Puede almacenar la  crema con corticoides en el refrigerador para Research scientist (medical) "refrescante" sobre la erupcin que har aliviar la picazn. Esto aliviar la picazn. En los casos ms graves ser necesario aplicar corticoides por va oral.  Ungentos con antibiticos o antibacterianos, si hay una infeccin en la piel.  Antihistamnicos en forma de locin o por va oral para calmar la picazn.  Lubricantes para mantener la humectacin de la piel.  La solucin de [place] para reducir el enrojecimiento y Conservation officer, historic buildings o para secar una erupcin que supura. Mezcle un paquete o tableta en dos tazas de agua fra. Moje un pao limpio en la solucin, escrralo un poco y colquelo en el rea afectada. Djelo en el lugar durante 30 minutos. Repita el procedimiento todas las veces que pueda a lo largo del Training and development officer.  Hgase baos con almidn o bicarbonato todos los das si la zona es demasiado extensa  como para cubrirla con una toallita. Algunas sustancias qumicas, como los lcalis o los cidos pueden daar la piel del mismo modo que [place]. Enjuague la piel durante 15 a 20 minutos con agua fra despus de la exposicin a esas sustancias. Tambin busque atencin mdica de inmediato. En los casos de piel muy irritada, ser necesario aplicar (vendajes), antibiticos y analgsicos.  INSTRUCCIONES PARA EL CUIDADO EN EL HOGAR   Evite lo que ha causado la erupcin.  Mantenga el rea de la piel afectada sin contacto con el agua caliente, el jabn, la luz solar, las sustancias qumicas, sustancias cidas o todo lo que la irrite.  No se rasque la lesin. El rascado puede hacer que la erupcin se infecte.  Puede tomar baos con agua fresca para detener la picazn.  Tome slo medicamentos de venta libre o recetados, segn las indicaciones del mdico.  Consulting civil engineer a las visitas de control segn las indicaciones, para asegurarse de que la piel se est curando Product manager. SOLICITE ATENCIN MDICA SI:   El problema no mejora luego de 3  das de [place].  Se siente empeorar.  Observa signos de infeccin, como hinchazn, sensibilidad, inflamacin, enrojecimiento o aumenta la temperatura en la zona afectada.  Tiene nuevos problemas debido a los medicamentos. Document Released: 02/12/2005 Document Revised: 07/28/2011 [facility] Patient Information 2015 [place]. This information is not intended to replace advice given to you by your health care provider. Make sure you discuss any questions you have with your health care provider.

## 2014-07-16 ENCOUNTER — Emergency Department (HOSPITAL_COMMUNITY)
Admission: EM | Admit: 2014-07-16 | Discharge: 2014-07-16 | Disposition: A | Discharge status: Home / Self Care | Payer: Self-pay | Attending: Emergency Medicine | Admitting: Emergency Medicine

## 2014-07-16 ENCOUNTER — Encounter (HOSPITAL_COMMUNITY): Payer: Self-pay | Admitting: Emergency Medicine

## 2014-07-16 ENCOUNTER — Emergency Department (HOSPITAL_COMMUNITY): Payer: Self-pay

## 2014-07-16 DIAGNOSIS — Y990 Civilian activity done for income or pay: Secondary | ICD-10-CM | POA: Insufficient documentation

## 2014-07-16 DIAGNOSIS — W1839XA Other fall on same level, initial encounter: Secondary | ICD-10-CM | POA: Insufficient documentation

## 2014-07-16 DIAGNOSIS — Z7952 Long term (current) use of systemic steroids: Secondary | ICD-10-CM | POA: Insufficient documentation

## 2014-07-16 DIAGNOSIS — S20211A Contusion of right front wall of thorax, initial encounter: Secondary | ICD-10-CM | POA: Insufficient documentation

## 2014-07-16 DIAGNOSIS — Y9389 Activity, other specified: Secondary | ICD-10-CM | POA: Insufficient documentation

## 2014-07-16 DIAGNOSIS — Z87891 Personal history of nicotine dependence: Secondary | ICD-10-CM | POA: Insufficient documentation

## 2014-07-16 DIAGNOSIS — S20419A Abrasion of unspecified back wall of thorax, initial encounter: Secondary | ICD-10-CM | POA: Insufficient documentation

## 2014-07-16 DIAGNOSIS — Y9289 Other specified places as the place of occurrence of the external cause: Secondary | ICD-10-CM | POA: Insufficient documentation

## 2014-07-16 MED ORDER — IBUPROFEN 800 MG PO TABS: 800.0000 mg | ORAL_TABLET | Freq: Three times a day (TID) | ORAL | Status: DC

## 2014-07-16 MED ORDER — HYDROCODONE-ACETAMINOPHEN 5-325 MG PO TABS: 1.0000 | ORAL_TABLET | Freq: Four times a day (QID) | ORAL | Status: DC | PRN

## 2014-07-16 NOTE — ED Notes (Signed)
Pt from home c/o right sided rib pain and back pain from a fall 5 days ago. 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

## 2014-07-16 NOTE — ED Provider Notes (Signed)
CSN: [id number]     Arrival date & time 07/16/14  1418 History  This chart was scribed for non-physician practitioner Kerrie Buffalo, NP, working with Donnetta Hutching, MD by Littie Deeds, ED Scribe. This patient was seen in room WTR6/WTR6 and the patient's care was started at 4:51 PM.      Chief Complaint  Patient presents with  . Fall  . Back Pain   Patient is a 24 y.o. male presenting with fall and back pain. The history is provided by the patient and the spouse. A language interpreter was used.  Fall This is a new problem. The current episode started more than 2 days ago. The problem has not changed since onset.Pertinent negatives include no shortness of breath. The treatment provided no relief.  Back Pain   HPI Comments: Frederick Caldwell is a 24 y.o. male who presents to the Emergency Department complaining of fall at work that occurred 4-5 days ago. Patient slipped and fell from a room while attached to a harness. He did not hit the ground, but he did hit the building. Patient reports having associated right-sided rib pain and back pain. The pain is worsened with movement and breathing. He has taken ibuprofen but without relief. Patient denies LOC, SOB, and loss of bowel or bladder control.   History reviewed. No pertinent past medical history. Past Surgical History  Procedure Laterality Date  . Eye surgery     Family History  Problem Relation Age of Onset  . Blindness Mother   . Down syndrome Sister    History  Substance Use Topics  . Smoking status: Former Smoker    Quit date: 03/18/2014  . Smokeless tobacco: Not on file  . Alcohol Use: Yes     Comment: social    Review of Systems  Respiratory: Negative for shortness of breath.   Genitourinary: Negative for enuresis.  Musculoskeletal: Positive for back pain.       Posterior rib pain right  Skin: Positive for wound.  Neurological: Negative for syncope.  all other systems negative    Allergies  Review of patient's  allergies indicates no known allergies.  Home Medications   Prior to Admission medications   Medication Sig Start Date End Date Taking? Authorizing Provider  amoxicillin-clavulanate (AUGMENTIN) 875-125 MG per tablet Take 1 tablet by mouth 2 (two) times daily. One po bid x 7 days Patient not taking: Reported on 05/13/2014 09/02/12   Lisette Paz, PA-C  cyclobenzaprine (FLEXERIL) 10 MG tablet Take 1 tablet (10 mg total) by mouth 2 (two) times daily as needed for muscle spasms. 05/13/14   Tiffany Irine Seal, PA-C  guaiFENesin-codeine 100-10 MG/5ML syrup Take 5 mLs by mouth 3 (three) times daily as needed for cough. Patient not taking: Reported on 05/13/2014 05/01/14   Santiago Glad, PA-C  HYDROcodone-acetaminophen (NORCO) 5-325 MG per tablet Take 1 tablet by mouth every 6 (six) hours as needed for moderate pain. 07/16/14   Verdelle Valtierra Orlene Och, NP  hydrocortisone cream 1 % Apply to affected area 2 times daily 05/13/14   Dorthula Matas, PA-C  ibuprofen (ADVIL,MOTRIN) 800 MG tablet Take 1 tablet (800 mg total) by mouth 3 (three) times daily. 07/16/14   Jacobs Golab Orlene Och, NP   BP 105/67 mmHg  Pulse 65  Temp(Src) 98.7 F (37.1 C) (Oral)  Resp 18  SpO2 100% Physical Exam  Constitutional: He is oriented to person, place, and time. He appears well-developed and well-nourished. No distress.  HENT:  Head: Normocephalic and atraumatic.  Eyes: EOM are normal. Pupils are equal, round, and reactive to light.  Neck: Normal range of motion. Neck supple.  Cardiovascular: Normal rate and regular rhythm.   Pulmonary/Chest: Effort normal. No respiratory distress. He has no wheezes. He has no rales.  Abdominal: Soft. Bowel sounds are normal. There is no tenderness.  Musculoskeletal: Normal range of motion. He exhibits no edema.       Lumbar back: He exhibits tenderness. He exhibits normal range of motion, no deformity, no spasm and normal pulse.  Neurological: He is alert and oriented to person, place, and time. He has  normal strength. No cranial nerve deficit or sensory deficit. Coordination and gait normal.  Reflex Scores:      Bicep reflexes are 2+ on the right side and 2+ on the left side.      Brachioradialis reflexes are 2+ on the right side and 2+ on the left side.      Patellar reflexes are 2+ on the right side and 2+ on the left side.      Achilles reflexes are 2+ on the right side and 2+ on the left side. Skin: Skin is warm and dry.  Abrasion noted to the posterior ribs and mid back. TTP to the area.  Psychiatric: He has a normal mood and affect. His behavior is normal.  Nursing note and vitals reviewed.   ED Course  Procedures  DIAGNOSTIC STUDIES: Oxygen Saturation is 100% on room air, normal by my interpretation.    COORDINATION OF CARE: 4:54 PM-Discussed treatment plan which includes XR imaging with pt at bedside and pt agreed to plan.    Labs Review Labs Reviewed - No data to display  Imaging Review Dg Ribs Unilateral W/chest Right  07/16/2014   CLINICAL DATA:  Right lower rib pain, status post fall 5 days ago  EXAM: RIGHT RIBS AND CHEST - 3+ VIEW  COMPARISON:  Chest radiographs dated 05/01/2014  FINDINGS: Lungs are clear.  No pleural effusion or pneumothorax.  The heart is normal in size.  No displaced right rib fracture is seen.  IMPRESSION: No evidence of acute cardiopulmonary disease.  No displaced right rib fracture is seen.   Electronically Signed   By: Charline BillsSriyesh  Krishnan M.D.   On: 07/16/2014 17:47     MDM  24 y.o. male with right rib contusion s/p injury at work 5 days ago. Will treat for pain and inflammation and muscle spasm. Stable for discharge without shortness of breath and normal x-ray. Discussed with the patient clinical and x-ray findings and plan of care. All questioned fully answered. He will return if any problems arise.  Final diagnoses:  Contusion of ribs, right, initial encounter   I personally performed the services described in this documentation, which was  scribed in my presence. The recorded information has been reviewed and is accurate.   [address] Water DriveHope LakesideM Boysie Bonebrake, NP 07/17/14 1544  Donnetta HutchingBrian Cook, MD 07/19/14 [phone]

## 2015-02-06 ENCOUNTER — Encounter (HOSPITAL_COMMUNITY): Payer: Self-pay | Admitting: Nurse Practitioner

## 2015-02-06 ENCOUNTER — Emergency Department (HOSPITAL_COMMUNITY)
Admission: EM | Admit: 2015-02-06 | Discharge: 2015-02-07 | Discharge status: Against Medical Advice | Payer: Self-pay | Attending: Emergency Medicine | Admitting: Emergency Medicine

## 2015-02-06 DIAGNOSIS — M79605 Pain in left leg: Secondary | ICD-10-CM | POA: Insufficient documentation

## 2015-02-06 DIAGNOSIS — M79604 Pain in right leg: Secondary | ICD-10-CM | POA: Insufficient documentation

## 2015-02-06 DIAGNOSIS — R109 Unspecified abdominal pain: Secondary | ICD-10-CM | POA: Insufficient documentation

## 2015-02-06 NOTE — ED Notes (Signed)
Pt is c/o bilateral flank/ribb pains that he describes as cramping, same with his legs and he adds that it is intermittent.

## 2015-02-07 NOTE — ED Notes (Signed)
Called to a room x3. No answer/sign of patient.

## 2015-11-28 ENCOUNTER — Encounter (HOSPITAL_BASED_OUTPATIENT_CLINIC_OR_DEPARTMENT_OTHER): Payer: Self-pay

## 2015-11-28 ENCOUNTER — Emergency Department (HOSPITAL_BASED_OUTPATIENT_CLINIC_OR_DEPARTMENT_OTHER): Payer: Self-pay

## 2015-11-28 ENCOUNTER — Emergency Department (HOSPITAL_BASED_OUTPATIENT_CLINIC_OR_DEPARTMENT_OTHER)
Admission: EM | Admit: 2015-11-28 | Discharge: 2015-11-28 | Disposition: A | Discharge status: Home / Self Care | Payer: Self-pay | Attending: Emergency Medicine | Admitting: Emergency Medicine

## 2015-11-28 DIAGNOSIS — R059 Cough, unspecified: Secondary | ICD-10-CM

## 2015-11-28 DIAGNOSIS — J3489 Other specified disorders of nose and nasal sinuses: Secondary | ICD-10-CM | POA: Insufficient documentation

## 2015-11-28 DIAGNOSIS — Z87891 Personal history of nicotine dependence: Secondary | ICD-10-CM | POA: Insufficient documentation

## 2015-11-28 DIAGNOSIS — R05 Cough: Secondary | ICD-10-CM | POA: Insufficient documentation

## 2015-11-28 MED ORDER — BENZONATATE 100 MG PO CAPS: 100.0000 mg | ORAL_CAPSULE | Freq: Three times a day (TID) | ORAL | Status: AC | PRN

## 2015-11-28 NOTE — ED Notes (Signed)
MD at bedside with Spanish interpreter on the phone.
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
## 2015-11-28 NOTE — ED Notes (Signed)
Cough since July 2-wife interpreting for -pt NAD-steady gait

## 2015-12-01 ENCOUNTER — Emergency Department (HOSPITAL_BASED_OUTPATIENT_CLINIC_OR_DEPARTMENT_OTHER)
Admission: EM | Admit: 2015-12-01 | Discharge: 2015-12-02 | Disposition: A | Discharge status: Home / Self Care | Payer: Self-pay | Attending: Emergency Medicine | Admitting: Emergency Medicine

## 2015-12-01 ENCOUNTER — Encounter (HOSPITAL_BASED_OUTPATIENT_CLINIC_OR_DEPARTMENT_OTHER): Payer: Self-pay | Admitting: Emergency Medicine

## 2015-12-01 ENCOUNTER — Emergency Department (HOSPITAL_BASED_OUTPATIENT_CLINIC_OR_DEPARTMENT_OTHER): Payer: Self-pay

## 2015-12-01 DIAGNOSIS — Z87891 Personal history of nicotine dependence: Secondary | ICD-10-CM | POA: Insufficient documentation

## 2015-12-01 DIAGNOSIS — J3089 Other allergic rhinitis: Secondary | ICD-10-CM | POA: Insufficient documentation

## 2015-12-01 MED ORDER — LORATADINE 10 MG PO TABS
10.0000 mg | ORAL_TABLET | Freq: Once | ORAL | Status: AC
  Administered 2015-12-02: 10 mg via ORAL
  Filled 2015-12-01: qty 1

## 2015-12-01 MED ORDER — FLUTICASONE PROPIONATE 50 MCG/ACT NA SUSP: 2.0000 | Freq: Every day | NASAL | Status: AC

## 2015-12-01 MED ORDER — PREDNISONE 10 MG PO TABS
60.0000 mg | ORAL_TABLET | Freq: Once | ORAL | Status: AC
  Administered 2015-12-02: 60 mg via ORAL
  Filled 2015-12-01: qty 1

## 2015-12-01 MED ORDER — PREDNISONE 20 MG PO TABS: ORAL_TABLET | ORAL | Status: AC

## 2015-12-01 MED ORDER — IBUPROFEN 800 MG PO TABS
800.0000 mg | ORAL_TABLET | Freq: Once | ORAL | Status: AC
  Administered 2015-12-02: 800 mg via ORAL
  Filled 2015-12-01: qty 1

## 2015-12-01 MED ORDER — CETIRIZINE HCL 10 MG PO CAPS: 10.0000 mg | ORAL_CAPSULE | Freq: Every day | ORAL | Status: AC

## 2015-12-01 NOTE — ED Provider Notes (Signed)
CSN: [id number]     Arrival date & time 12/01/15  2145 History  By signing my name below, I, Frederick Caldwell, attest that this documentation has been prepared under the direction and in the presence of Rane Dumm, MD. Electronically Signed: [name], ED Scribe. 12/01/2015. 11:46 PM.     Chief Complaint  Patient presents with  . Cough   Patient is a 25 y.o. male presenting with cough. The history is provided by the patient. A language interpreter was used (wife used as interpreter).  Cough Cough characteristics:  Dry Severity:  Mild Onset quality:  Gradual Duration:  2 weeks Context: not sick contacts   Relieved by:  Nothing Ineffective treatments:  Cough suppressants Associated symptoms: rhinorrhea   Associated symptoms: no fever    HPI Comments: Frederick Caldwell is a 25 y.o. male who presents to the Emergency Department complaining of gradually worsening cough onset 2 weeks prior. Pt reports associated chest tightness, postnasal drip and left sided rib pain. Pt has tried tessalon perls, Nyquil and dayquil with no relief. Pt doesn't report any other symptoms.   History reviewed. No pertinent past medical history. Past Surgical History  Procedure Laterality Date  . Eye surgery     Family History  Problem Relation Age of Onset  . Blindness Mother   . Down syndrome Sister    Social History  Substance Use Topics  . Smoking status: Former Smoker    Quit date: 03/18/2014  . Smokeless tobacco: None  . Alcohol Use: Yes     Comment: social    Review of Systems  Constitutional: Negative for fever.  HENT: Positive for postnasal drip and rhinorrhea.   Respiratory: Positive for cough and chest tightness.   All other systems reviewed and are negative.    Allergies  Review of patient's allergies indicates no known allergies.  Home Medications   Prior to Admission medications   Medication Sig Start Date End Date Taking? Authorizing Provider  benzonatate  (TESSALON) 100 MG capsule Take 1 capsule (100 mg total) by mouth 3 (three) times daily as needed for cough. 11/28/15   Ambrose Finland Little, MD   BP 105/79 mmHg  Pulse 70  Temp(Src) 98 F (36.7 C) (Oral)  Resp 18  Wt 135 lb (61.236 kg)  SpO2 100%   Physical Exam  Constitutional: He is oriented to person, place, and time. He appears well-developed and well-nourished. No distress.  HENT:  Head: Normocephalic and atraumatic.  Mouth/Throat: Oropharynx is clear and moist.  Clear colorless postnasal drip.   Eyes: Conjunctivae and EOM are normal.  Neck: Normal range of motion. Neck supple. No tracheal deviation present.  Cardiovascular: Normal rate, regular rhythm and normal heart sounds.   Pulmonary/Chest: Effort normal and breath sounds normal. No stridor. No respiratory distress. He has no wheezes. He has no rales.  Abdominal: Soft. There is no tenderness. There is no rebound and no guarding.  Musculoskeletal: Normal range of motion.  Neurological: He is alert and oriented to person, place, and time. He has normal reflexes.  Skin: Skin is warm and dry.  ezcema   Psychiatric: He has a normal mood and affect. His behavior is normal.  Nursing note and vitals reviewed.   ED Course  Procedures (including critical care time) DIAGNOSTIC STUDIES: Oxygen Saturation is 100% on RA, normal by my interpretation.    COORDINATION OF CARE: 11:46 PM-Discussed treatment plan which includes CXR with pt at bedside and pt agreed to plan.     Labs Review  Labs Reviewed - No data to display  Imaging Review Dg Ribs Unilateral W/chest Left  12/01/2015  CLINICAL DATA:  Persistent cough for 12 days. Worsening left rib pain. EXAM: LEFT RIBS AND CHEST - 3+ VIEW COMPARISON:  11/28/2015 FINDINGS: Slightly shallow inspiration. Normal heart size and pulmonary vascularity. No focal airspace disease or consolidation in the lungs. No blunting of costophrenic angles. No pneumothorax. Mediastinal contours appear  intact. Left ribs appear intact. No acute displaced fractures or focal bone lesions identified. IMPRESSION: No evidence of active pulmonary disease.  Negative left ribs. Electronically Signed   By: Burman NievesWilliam  Stevens M.D.   On: 12/01/2015 22:54   I have personally reviewed and evaluated these images  as part of my medical decision-making.   EKG Interpretation None      MDM   Final diagnoses:  None   Filed Vitals:   12/01/15 2158 12/01/15 2355  BP: 105/79 107/68  Pulse: 70 65  Temp: 98 F (36.7 C) 98.3 F (36.8 C)  Resp: 18 20    No results found for this or any previous visit. Dg Chest 2 View  11/28/2015  CLINICAL DATA:  Cough and congestion for 11 days. EXAM: CHEST  2 VIEW COMPARISON:  Chest and rib radiographs 07/16/2014 FINDINGS: The heart size and mediastinal contours are within normal limits. Both lungs are clear. The visualized skeletal structures are unremarkable. IMPRESSION: Negative two view chest x-ray. Electronically Signed   By: Marin Robertshristopher  Mattern M.D.   On: 11/28/2015 21:31   Dg Ribs Unilateral W/chest Left  12/01/2015  CLINICAL DATA:  Persistent cough for 12 days. Worsening left rib pain. EXAM: LEFT RIBS AND CHEST - 3+ VIEW COMPARISON:  11/28/2015 FINDINGS: Slightly shallow inspiration. Normal heart size and pulmonary vascularity. No focal airspace disease or consolidation in the lungs. No blunting of costophrenic angles. No pneumothorax. Mediastinal contours appear intact. Left ribs appear intact. No acute displaced fractures or focal bone lesions identified. IMPRESSION: No evidence of active pulmonary disease.  Negative left ribs. Electronically Signed   By: Burman NievesWilliam  Stevens M.D.   On: 12/01/2015 22:54    Medications  loratadine (CLARITIN) tablet 10 mg (10 mg Oral Given 12/02/15 0015)  ibuprofen (ADVIL,MOTRIN) tablet 800 mg (800 mg Oral Given 12/02/15 0015)  predniSONE (DELTASONE) tablet 60 mg (60 mg Oral Given 12/02/15 0016)   I suspect this is allergic in nature.   Will start zyrtec steroids and flonase.  Motrin for pain  Based on history and exam patient has been appropriately medically screened and emergency conditions excluded. Patient is stable for discharge at this time. Follow up provided and strict return precautions given.    Medication List    TAKE these medications        Cetirizine HCl 10 MG Caps  Commonly known as:  ZYRTEC ALLERGY  Take 1 capsule (10 mg total) by mouth daily.     fluticasone 50 MCG/ACT nasal spray  Commonly known as:  FLONASE  Place 2 sprays into both nostrils daily.     predniSONE 20 MG tablet  Commonly known as:  DELTASONE  3 tabs po day one, then 2 po daily x 4 days      ASK your doctor about these medications        benzonatate 100 MG capsule  Commonly known as:  TESSALON  Take 1 capsule (100 mg total) by mouth 3 (three) times daily as needed for cough.        I personally performed the services described in  this documentation, which was scribed in my presence. The recorded information has been reviewed and is accurate.        Cy Blamer, MD 12/02/15 [phone]

## 2015-12-01 NOTE — ED Notes (Signed)
Patient is having worse coughing and chest tightness.

## 2015-12-01 NOTE — ED Notes (Signed)
Wife reports pt began coughing on July 1. Seen here on the 12th. CXR-nrl. Also taking Tessalon Perls and OTC meds without relief. Reports feeling tight in chest and left rib pain.  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 Nasal congestion as well.

## 2015-12-01 NOTE — ED Notes (Signed)
MD at bedside. 

## 2015-12-01 NOTE — Discharge Instructions (Signed)
Rinitis alrgica (Allergic Rhinitis) La rinitis alrgica ocurre cuando las membranas mucosas de la nariz responden a los alrgenos. Los alrgenos son las partculas que estn en el aire y que hacen que el cuerpo tenga una reaccin alrgica. Esto hace que usted libere anticuerpos alrgicos. A travs de una cadena de eventos, estos finalmente hacen que usted libere histamina en la corriente sangunea. Aunque la funcin de la histamina es proteger al organismo, es esta liberacin de histamina lo que provoca malestar, como los estornudos frecuentes, la congestin y goteo y picazn nasales.  CAUSAS La causa de la rinitis alrgica estacional (fiebre del heno) son los alrgenos del polen que pueden provenir del csped, los rboles y la maleza. La causa de la rinitis alrgica permanente (rinitis alrgica perenne) son los alrgenos, como los caros del polvo domstico, la caspa de las mascotas y las esporas del moho. SNTOMAS  Secrecin nasal (congestin).  Goteo y picazn nasales con estornudos y lagrimeo. DIAGNSTICO Su mdico puede ayudarlo a determinar el alrgeno o los alrgenos que desencadenan sus sntomas. Si usted y su mdico no pueden determinar cul es el alrgeno, pueden hacerse anlisis de sangre o estudios de la piel. El mdico diagnosticar la afeccin despus de hacerle una historia clnica y un examen fsico. Adems, puede evaluarlo para detectar la presencia de otras enfermedades afines, como asma, conjuntivitis u otitis. TRATAMIENTO La rinitis alrgica no tiene cura, pero puede controlarse con lo siguiente:  Medicamentos que inhiben los sntomas de alergia, por ejemplo, vacunas contra la alergia, aerosoles nasales y antihistamnicos por va oral.  Evitar el alrgeno. La fiebre del heno a menudo puede tratarse con antihistamnicos en las formas de pldoras o aerosol nasal. Los antihistamnicos bloquean los efectos de la histamina. Existen medicamentos de venta libre que pueden ayudar con  la congestin nasal y la hinchazn alrededor de los ojos. Consulte a su mdico antes de tomar o administrarse este medicamento. Si la prevencin del alrgeno o el medicamento recetado no dan resultado, existen muchos medicamentos nuevos que su mdico puede recetarle. Pueden usarse medicamentos ms fuertes si las medidas iniciales no son efectivas. Pueden aplicarse inyecciones desensibilizantes si los medicamentos y la prevencin no funcionan. La desensibilizacin ocurre cuando un paciente recibe vacunas constantes hasta que el cuerpo se vuelve menos sensible al alrgeno. Asegrese de realizar un seguimiento con su mdico si los problemas continan. INSTRUCCIONES PARA EL CUIDADO EN EL HOGAR No es posible evitar por completo los alrgenos, pero puede reducir los sntomas al tomar medidas para limitar su exposicin a ellos. Es muy til saber exactamente a qu es alrgico para que pueda evitar sus desencadenantes especficos. SOLICITE ATENCIN MDICA SI:  Tiene fiebre.  Desarrolla una tos que no cesa fcilmente (persistente).  Le falta el aire.  Comienza a tener sibilancias.  Los sntomas interfieren con las actividades diarias normales.   Esta informacin no tiene como fin reemplazar el consejo del mdico. Asegrese de hacerle al mdico cualquier pregunta que tenga.   Document Released: 02/12/2005 Document Revised: 05/26/2014 Elsevier Interactive Patient Education 2016 [employer] Inc.   

## 2015-12-02 ENCOUNTER — Encounter (HOSPITAL_BASED_OUTPATIENT_CLINIC_OR_DEPARTMENT_OTHER): Payer: Self-pay | Admitting: Emergency Medicine

## 2015-12-02 NOTE — ED Notes (Signed)
Pt and wife given d/c instructions as per chart. Verbalize understanding. No questions. Rx x 3

## 2016-06-15 ENCOUNTER — Encounter (HOSPITAL_COMMUNITY): Payer: Self-pay

## 2016-06-15 ENCOUNTER — Emergency Department (HOSPITAL_COMMUNITY)
Admission: EM | Admit: 2016-06-15 | Discharge: 2016-06-15 | Disposition: A | Discharge status: Home / Self Care | Payer: Self-pay | Attending: Emergency Medicine | Admitting: Emergency Medicine

## 2016-06-15 DIAGNOSIS — Y929 Unspecified place or not applicable: Secondary | ICD-10-CM | POA: Insufficient documentation

## 2016-06-15 DIAGNOSIS — Y939 Activity, unspecified: Secondary | ICD-10-CM | POA: Insufficient documentation

## 2016-06-15 DIAGNOSIS — S01512A Laceration without foreign body of oral cavity, initial encounter: Secondary | ICD-10-CM | POA: Insufficient documentation

## 2016-06-15 DIAGNOSIS — Y999 Unspecified external cause status: Secondary | ICD-10-CM | POA: Insufficient documentation

## 2016-06-15 DIAGNOSIS — Z87891 Personal history of nicotine dependence: Secondary | ICD-10-CM | POA: Insufficient documentation

## 2016-06-15 DIAGNOSIS — W228XXA Striking against or struck by other objects, initial encounter: Secondary | ICD-10-CM | POA: Insufficient documentation

## 2016-06-15 MED ORDER — CHLORHEXIDINE GLUCONATE 0.12 % MT SOLN: 15.0000 mL | Freq: Two times a day (BID) | OROMUCOSAL | 0 refills | Status: AC

## 2016-06-15 NOTE — ED Triage Notes (Addendum)
Pt c/o injury to gum line above front teeth x 2 days.  Denies pain.  Pt's visitor report that he was working and got hit in the mouth w/ a shingle.  Skin flap noted on gum above L front tooth.     Pt has been using mouth wash and he been eating and drinking normally.

## 2016-06-15 NOTE — ED Provider Notes (Signed)
[facility]-EMERGENCY DEPT Provider Note   CSN: [id number] Arrival date & time: 06/15/16  1855     History   Chief Complaint Chief Complaint  Patient presents with  . Mouth Injury    HPI Frederick Caldwell is a 26 y.o. male.  HPI Frederick Caldwell is a 26 y.o. male presents to emergency department complaining of gum injury. Patient was working on a roof when one of the old shingles flew and hit him in the mouth. He sustained laceration to the upper gum. This occurred 2 days ago. Since then he has developed some swelling and discoloration to the lacerated area. He denies any dental pain. He denies any pain with biting. He denies any fever or chills. There is no facial swelling. His tetanus is up-to-date.  History reviewed. No pertinent past medical history.  There are no active problems to display for this patient.   Past Surgical History:  Procedure Laterality Date  . EYE SURGERY         Home Medications    Prior to Admission medications   Medication Sig Start Date End Date Taking? Authorizing Provider  benzonatate (TESSALON) 100 MG capsule Take 1 capsule (100 mg total) by mouth 3 (three) times daily as needed for cough. 11/28/15   Laurence Spates, MD  Cetirizine HCl (ZYRTEC ALLERGY) 10 MG CAPS Take 1 capsule (10 mg total) by mouth daily. 12/01/15   April Palumbo, MD  fluticasone [facility]) 50 MCG/ACT nasal spray Place 2 sprays into both nostrils daily. 12/01/15   April Palumbo, MD  predniSONE (DELTASONE) 20 MG tablet 3 tabs po day one, then 2 po daily x 4 days 12/01/15   April Palumbo, MD    Family History Family History  Problem Relation Age of Onset  . Blindness Mother   . Down syndrome Sister     Social History Social History  Substance Use Topics  . Smoking status: Former Smoker    Quit date: 03/18/2014  . Smokeless tobacco: Never Used  . Alcohol use Yes     Comment: social     Allergies   Patient has no known allergies.   Review of  Systems Review of Systems  Constitutional: Negative for chills and fever.  HENT: Positive for dental problem and mouth sores. Negative for congestion, ear pain and sore throat.   Genitourinary: Negative for dysuria and hematuria.  Musculoskeletal: Negative for arthralgias and back pain.  Skin: Negative for color change and rash.  All other systems reviewed and are negative.    Physical Exam Updated Vital Signs BP 119/63 (BP Location: Right Arm)   Pulse 72   Temp 98 F (36.7 C) (Oral)   Resp 18   SpO2 100%   Physical Exam  Constitutional: He is oriented to person, place, and time. He appears well-developed and well-nourished. No distress.  HENT:  Approximately 1 cm flap laceration to the upper gum, right above left central incisor. There is slight discoloration to the flap. There is no obvious infection, drainage. There is mild erythema to the frenulum, frenulum is intact. When flap is lifted, the root of the tooth is visible. There is no dental pain or tenderness of the touch of the tooth.  Eyes: Conjunctivae are normal.  Neck: Neck supple.  Cardiovascular: Normal rate.   Pulmonary/Chest: No respiratory distress.  Abdominal: He exhibits no distension.  Neurological: He is alert and oriented to person, place, and time.  Skin: Skin is warm and dry.  Nursing note and vitals reviewed.  ED Treatments / Results  Labs (all labs ordered are listed, but only abnormal results are displayed) Labs Reviewed - No data to display  EKG  EKG Interpretation None       Radiology No results found.  Procedures Procedures (including critical care time)  Medications Ordered in ED Medications - No data to display   Initial Impression / Assessment and Plan / ED Course  I have reviewed the triage vital signs and the nursing notes.  Pertinent labs & imaging results that were available during my care of the patient were reviewed by me and considered in my medical decision making  (see chart for details).     Patient in emergency department with, injury from 2 days ago. His tetanus is up-to-date. No signs of infection on exam. Patient does have a flap of skin that most likely will fall off. Discussed this with patient and his wife. Also discussed with Dr. Jeraldine LootsLockwood. No further treatment at this time. Close eye on any signs of infection. Follow-up with the dentist as needed. The tooth appears to be stable with no loosening. No dental pain or injury.  Vitals:   06/15/16 1900 06/15/16 2051  BP: 119/63 121/78  Pulse: 72 62  Resp: 18 16  Temp: 98 F (36.7 C) 98.4 F (36.9 C)  TempSrc: Oral Oral  SpO2: 100% 100%     Final Clinical Impressions(s) / ED Diagnoses   Final diagnoses:  Laceration of gum    New Prescriptions Discharge Medication List as of 06/15/2016  8:40 PM    START taking these medications   Details  chlorhexidine (PERIDEX) 0.12 % solution Use as directed 15 mLs in the mouth or throat 2 (two) times daily., Starting Sun 06/15/2016, Print         Jaynie Crumbleatyana Lemmie Vanlanen, PA-C 06/15/16 2300    Gerhard Munchobert Lockwood, MD 06/17/16 [phone]

## 2016-06-15 NOTE — Discharge Instructions (Signed)
Use peridex solution. The flap of skin may fall off. The skin under it should regenerate. Follow up with dentist if not improving.

## 2017-01-08 ENCOUNTER — Emergency Department (HOSPITAL_BASED_OUTPATIENT_CLINIC_OR_DEPARTMENT_OTHER)
Admission: EM | Admit: 2017-01-08 | Discharge: 2017-01-08 | Disposition: A | Discharge status: Home / Self Care | Payer: Self-pay | Attending: Emergency Medicine | Admitting: Emergency Medicine

## 2017-01-08 ENCOUNTER — Emergency Department (HOSPITAL_BASED_OUTPATIENT_CLINIC_OR_DEPARTMENT_OTHER): Payer: Self-pay

## 2017-01-08 ENCOUNTER — Encounter (HOSPITAL_BASED_OUTPATIENT_CLINIC_OR_DEPARTMENT_OTHER): Payer: Self-pay | Admitting: *Deleted

## 2017-01-08 DIAGNOSIS — M25512 Pain in left shoulder: Secondary | ICD-10-CM

## 2017-01-08 DIAGNOSIS — M7552 Bursitis of left shoulder: Secondary | ICD-10-CM | POA: Insufficient documentation

## 2017-01-08 DIAGNOSIS — Z87891 Personal history of nicotine dependence: Secondary | ICD-10-CM | POA: Insufficient documentation

## 2017-01-08 MED ORDER — ACETAMINOPHEN 500 MG PO TABS
1000.0000 mg | ORAL_TABLET | Freq: Once | ORAL | Status: AC
Start: 2017-01-08 — End: 2017-01-08
  Administered 2017-01-08: 1000 mg via ORAL
  Filled 2017-01-08: qty 2

## 2017-01-08 MED ORDER — KETOROLAC TROMETHAMINE 60 MG/2ML IM SOLN
15.0000 mg | Freq: Once | INTRAMUSCULAR | Status: AC
  Administered 2017-01-08: 15 mg via INTRAMUSCULAR
  Filled 2017-01-08: qty 2

## 2017-01-08 MED ORDER — OXYCODONE HCL 5 MG PO TABS
5.0000 mg | ORAL_TABLET | Freq: Once | ORAL | Status: AC
  Administered 2017-01-08: 5 mg via ORAL
  Filled 2017-01-08: qty 1

## 2017-01-08 NOTE — ED Notes (Signed)
Patient transported to x-ray. ?

## 2017-01-08 NOTE — ED Notes (Signed)
ED Provider at bedside. 

## 2017-01-08 NOTE — ED Triage Notes (Signed)
He tripped and fell a week ago. Injury to his left shoulder. Soreness continues. Worse when he raises his arm.

## 2017-01-08 NOTE — ED Provider Notes (Signed)
[facility] Provider Note   CSN: [id number] Arrival date & time: 01/08/17  1043     History   Chief Complaint Chief Complaint  Patient presents with  . Fall  . Shoulder Injury    HPI Frederick Caldwell is a 26 y.o. male.  26 yo M with a cc of left shoulder pain.  Going on for past week after mechanical fall. Pain worse with movement, mostly forward flexion.  Denies numbness or tingling.     The history is provided by the patient.  Fall  Pertinent negatives include no chest pain, no abdominal pain, no headaches and no shortness of breath.  Shoulder Injury  This is a new problem. The current episode started more than 1 week ago. The problem occurs constantly. The problem has been gradually worsening. Pertinent negatives include no chest pain, no abdominal pain, no headaches and no shortness of breath. Exacerbated by: raising his arm above his head. Nothing relieves the symptoms. He has tried nothing for the symptoms. The treatment provided no relief.    History reviewed. No pertinent past medical history.  There are no active problems to display for this patient.   Past Surgical History:  Procedure Laterality Date  . EYE SURGERY         Home Medications    Prior to Admission medications   Medication Sig Start Date End Date Taking? Authorizing Provider  benzonatate (TESSALON) 100 MG capsule Take 1 capsule (100 mg total) by mouth 3 (three) times daily as needed for cough. 11/28/15   Little, Ambrose Finland, MD  Cetirizine HCl (ZYRTEC ALLERGY) 10 MG CAPS Take 1 capsule (10 mg total) by mouth daily. 12/01/15   Palumbo, April, MD  chlorhexidine (PERIDEX) 0.12 % solution Use as directed 15 mLs in the mouth or throat 2 (two) times daily. 06/15/16   Kirichenko, Tatyana, PA-C  fluticasone (FLONASE) 50 MCG/ACT nasal spray Place 2 sprays into both nostrils daily. 12/01/15   Palumbo, April, MD  predniSONE (DELTASONE) 20 MG tablet 3 tabs po day one, then 2 po daily x 4  days 12/01/15   Nicanor Alcon, April, MD    Family History Family History  Problem Relation Age of Onset  . Blindness Mother   . Down syndrome Sister     Social History Social History  Substance Use Topics  . Smoking status: Former Smoker    Quit date: 03/18/2014  . Smokeless tobacco: Never Used  . Alcohol use Yes     Comment: social     Allergies   Patient has no known allergies.   Review of Systems Review of Systems  Constitutional: Negative for chills and fever.  HENT: Negative for congestion and facial swelling.   Eyes: Negative for discharge and visual disturbance.  Respiratory: Negative for shortness of breath.   Cardiovascular: Negative for chest pain and palpitations.  Gastrointestinal: Negative for abdominal pain, diarrhea and vomiting.  Musculoskeletal: Positive for arthralgias and myalgias.  Skin: Negative for color change and rash.  Neurological: Negative for tremors, syncope and headaches.  Psychiatric/Behavioral: Negative for confusion and dysphoric mood.     Physical Exam Updated Vital Signs BP 117/75   Pulse 62   Temp 98.2 F (36.8 C) (Oral)   Resp 18   Ht 5' (1.524 m)   Wt 61.2 kg (135 lb)   SpO2 100%   BMI 26.37 kg/m   Physical Exam  Constitutional: He is oriented to person, place, and time. He appears well-developed and well-nourished.  HENT:  Head:  Normocephalic and atraumatic.  Eyes: Pupils are equal, round, and reactive to light. EOM are normal.  Neck: Normal range of motion. Neck supple. No JVD present.  Cardiovascular: Normal rate and regular rhythm.  Exam reveals no gallop and no friction rub.   No murmur heard. Pulmonary/Chest: No respiratory distress. He has no wheezes.  Abdominal: He exhibits no distension. There is no rebound and no guarding.  Musculoskeletal: Normal range of motion. He exhibits tenderness. He exhibits no edema or deformity.  No TTP, pain worst on forward flexion to 160 degrees.  PMS intact distally.  SITS intact.    Neurological: He is alert and oriented to person, place, and time.  Skin: No rash noted. No pallor.  Psychiatric: He has a normal mood and affect. His behavior is normal.  Nursing note and vitals reviewed.    ED Treatments / Results  Labs (all labs ordered are listed, but only abnormal results are displayed) Labs Reviewed - No data to display  EKG  EKG Interpretation None       Radiology Dg Shoulder Left  Result Date: 01/08/2017 CLINICAL DATA:  Left shoulder pain. EXAM: LEFT SHOULDER - 2+ VIEW COMPARISON:  No recent prior . FINDINGS: No acute bony or joint abnormality identified. No evidence of fracture or dislocation. IMPRESSION: No acute abnormality. Electronically Signed   By: Maisie Fus  Register   On: 01/08/2017 11:21    Procedures Procedures (including critical care time)  Medications Ordered in ED Medications  acetaminophen (TYLENOL) tablet 1,000 mg (1,000 mg Oral Given 01/08/17 1116)  ketorolac (TORADOL) injection 15 mg (15 mg Intramuscular Given 01/08/17 1116)  oxyCODONE (Oxy IR/ROXICODONE) immediate release tablet 5 mg (5 mg Oral Given 01/08/17 1116)     Initial Impression / Assessment and Plan / ED Course  I have reviewed the triage vital signs and the nursing notes.  Pertinent labs & imaging results that were available during my care of the patient were reviewed by me and considered in my medical decision making (see chart for details).     26 yo M with left shoulder pain post fall.  Suspect subacromial bursitis. Discussed limitations of imaging, will obtain xray.   Xray negative, place in sling.  PCP follow up.   11:26 AM:  I have discussed the diagnosis/risks/treatment options with the patient and family and believe the pt to be eligible for discharge home to follow-up with PCP. We also discussed returning to the ED immediately if new or worsening sx occur. We discussed the sx which are most concerning (e.g., sudden worsening pain, fever, inability to tolerate  by mouth) that necessitate immediate return. Medications administered to the patient during their visit and any new prescriptions provided to the patient are listed below.  Medications given during this visit Medications  acetaminophen (TYLENOL) tablet 1,000 mg (1,000 mg Oral Given 01/08/17 1116)  ketorolac (TORADOL) injection 15 mg (15 mg Intramuscular Given 01/08/17 1116)  oxyCODONE (Oxy IR/ROXICODONE) immediate release tablet 5 mg (5 mg Oral Given 01/08/17 1116)     The patient appears reasonably screen and/or stabilized for discharge and I doubt any other medical condition or other [facility] requiring further screening, evaluation, or treatment in the ED at this time prior to discharge.      Final Clinical Impressions(s) / ED Diagnoses   Final diagnoses:  Subacromial bursitis of left shoulder joint  Acute pain of left shoulder    New Prescriptions New Prescriptions   No medications on file     Melene Plan, DO  01/08/17 1126  

## 2017-01-08 NOTE — Discharge Instructions (Signed)
Take 4 over the counter ibuprofen tablets 3 times a day or 2 over-the-counter naproxen tablets twice a day for pain. Also take tylenol 1000mg(2 extra strength) four times a day.    

## 2017-06-12 IMAGING — CR DG RIBS W/ CHEST 3+V*L*
3 series · 3 of 3 positions shown · non-contrast
Comparison: 11/28/2015

CLINICAL DATA: Persistent cough for 12 days. Worsening left rib
pain.

EXAM:
LEFT RIBS AND CHEST - 3+ VIEW

[w chest pa]
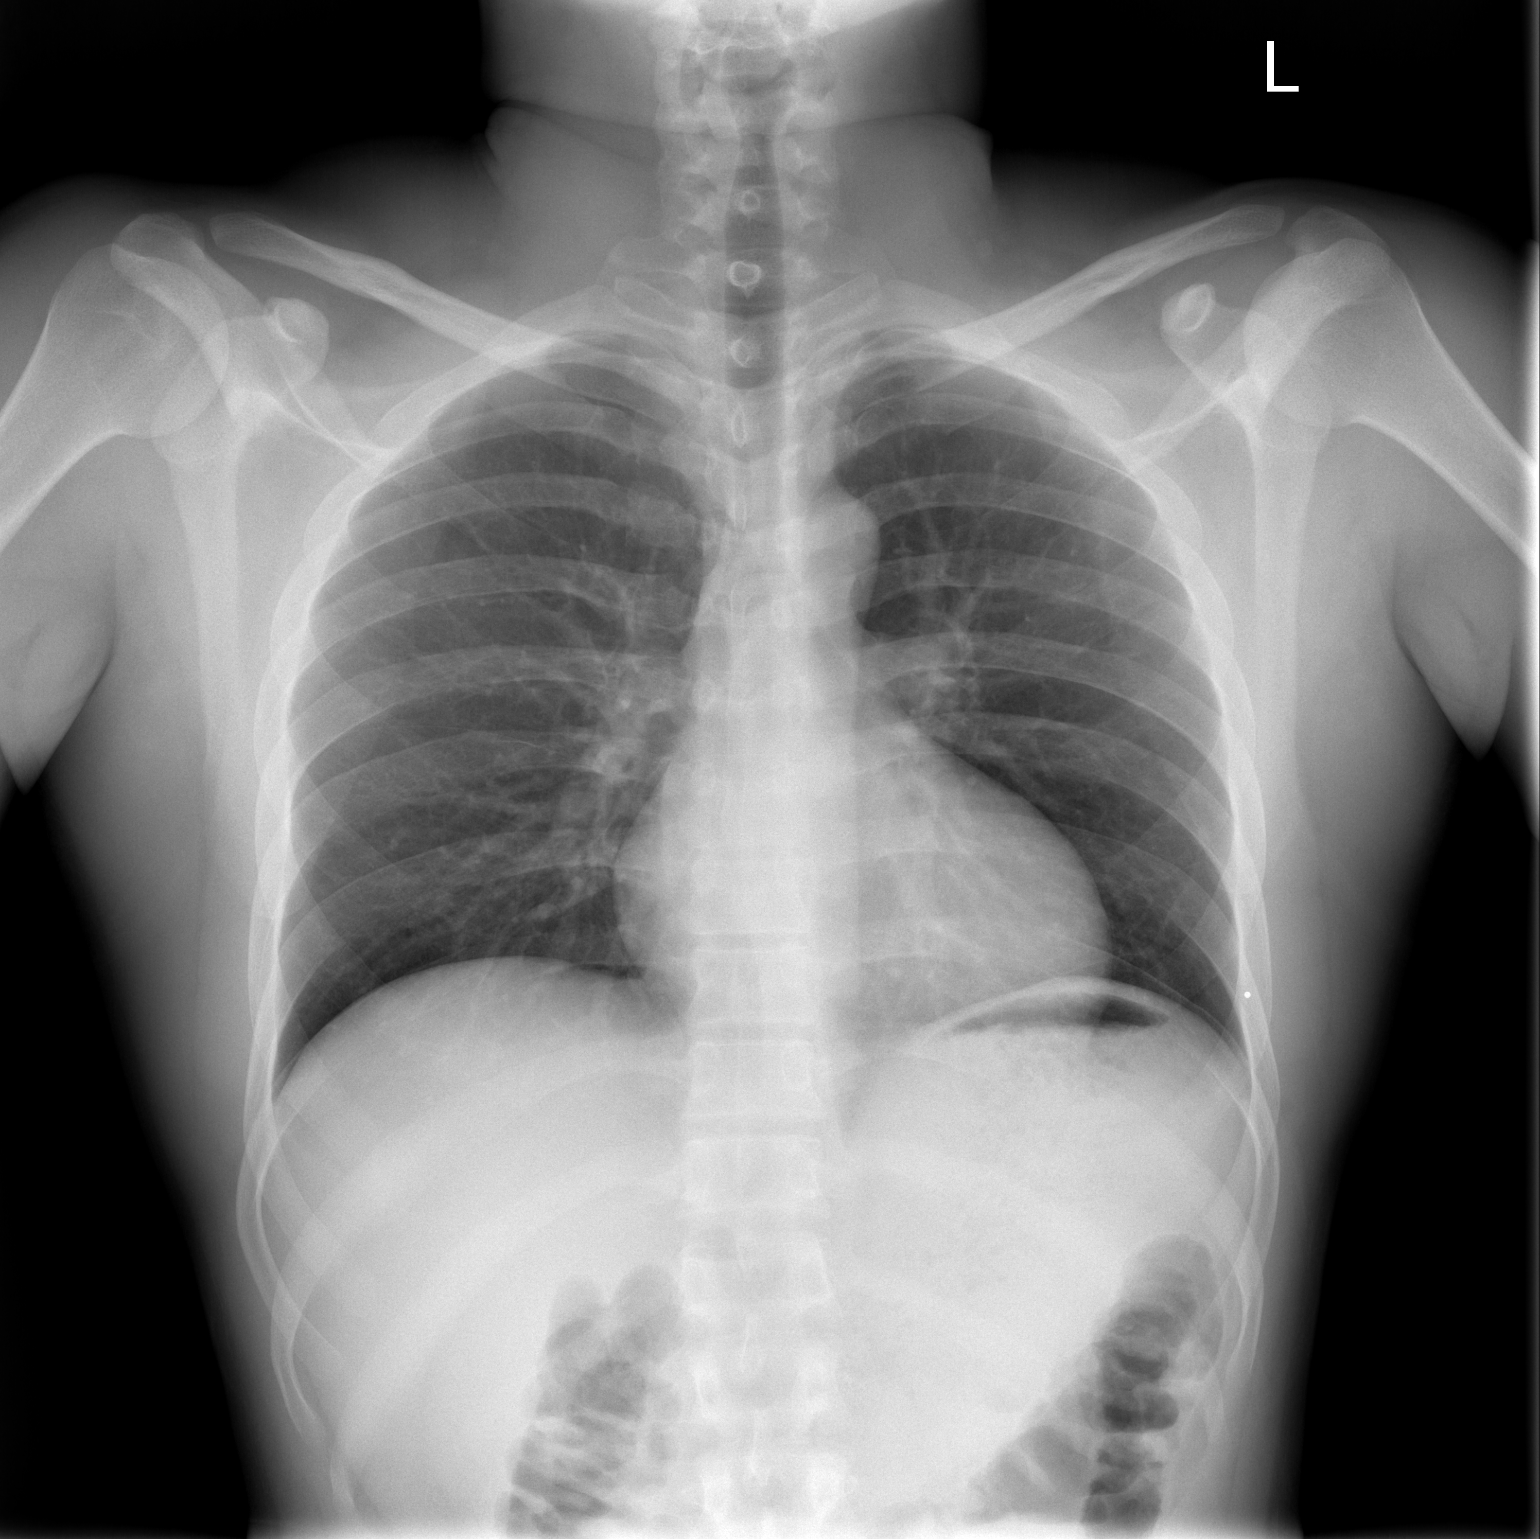

[w ribs ap/pa upper left]
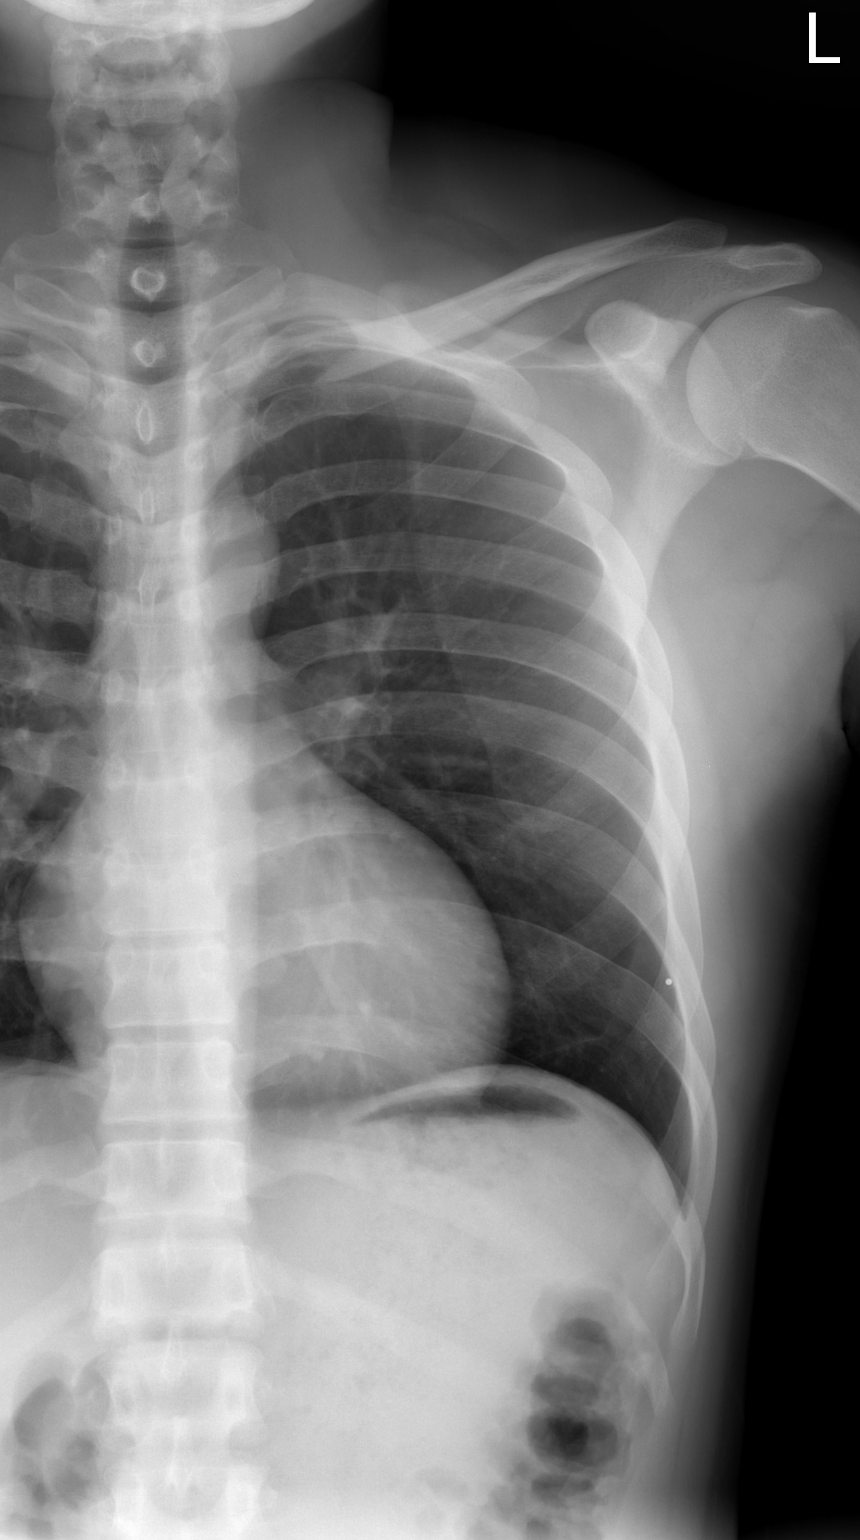

[w ribs oblique left]
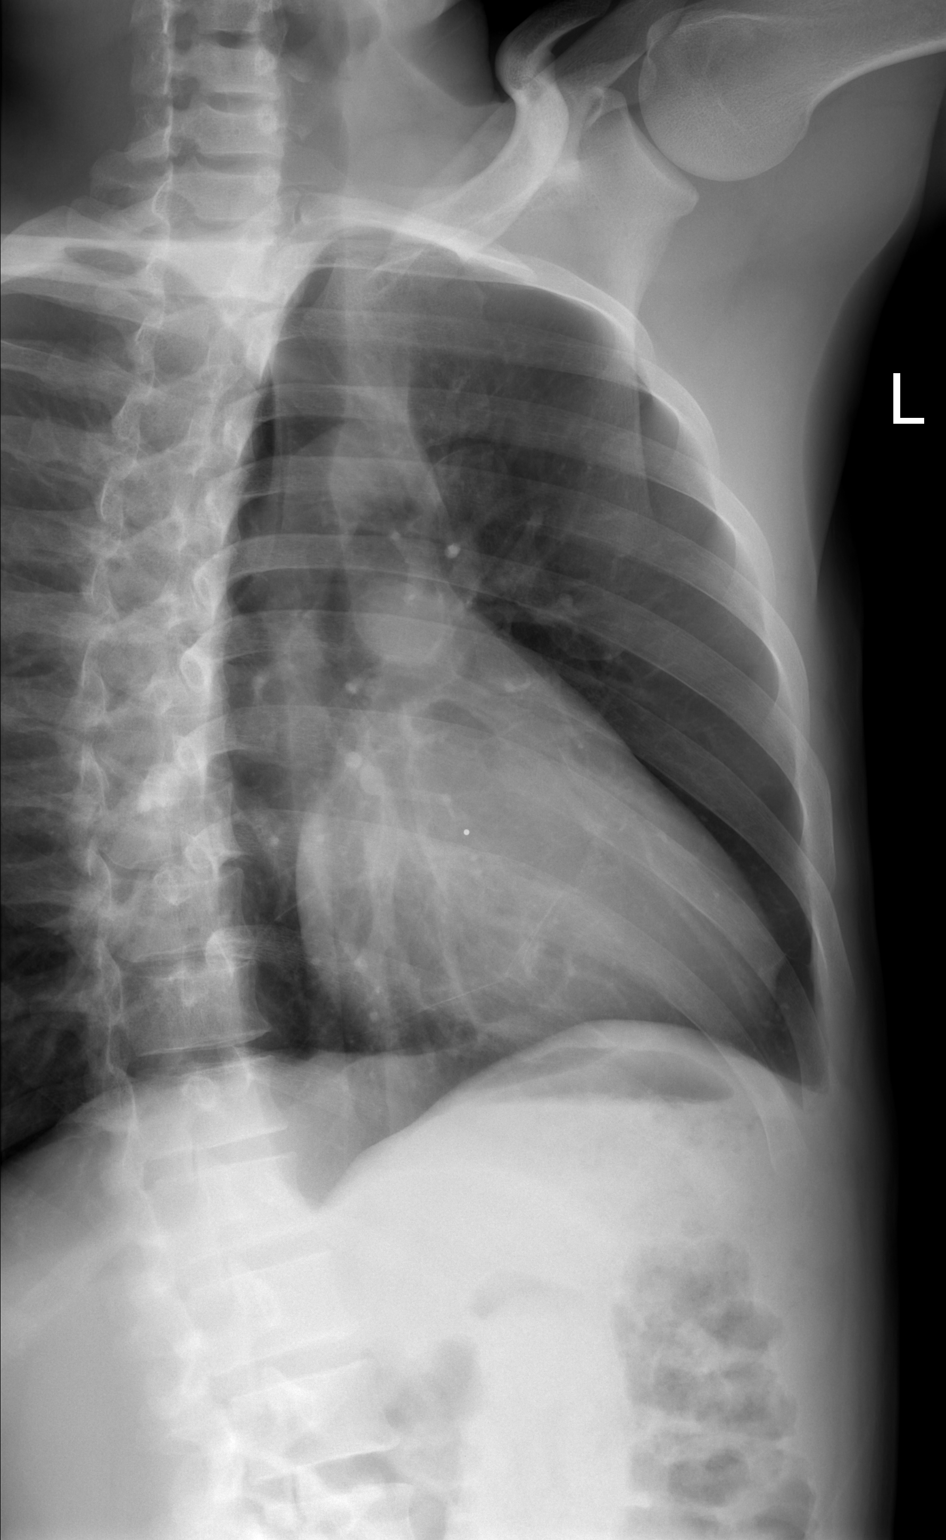

[3 of 3 positions shown; findings below may reference images not displayed]

FINDINGS: Slightly shallow inspiration. Normal heart size and pulmonary
vascularity. No focal airspace disease or consolidation in the
lungs. No blunting of costophrenic angles. No pneumothorax.
Mediastinal contours appear intact.

Left ribs appear intact. No acute displaced fractures or focal bone
lesions identified.
IMPRESSION: No evidence of active pulmonary disease.  Negative left ribs.

## 2018-07-21 IMAGING — CR DG SHOULDER 2+V*L*
3 series · 3 of 3 positions shown · non-contrast
Comparison: No recent prior .

CLINICAL DATA: Left shoulder pain.

EXAM:
LEFT SHOULDER - 2+ VIEW

[w shoulder grashey left]
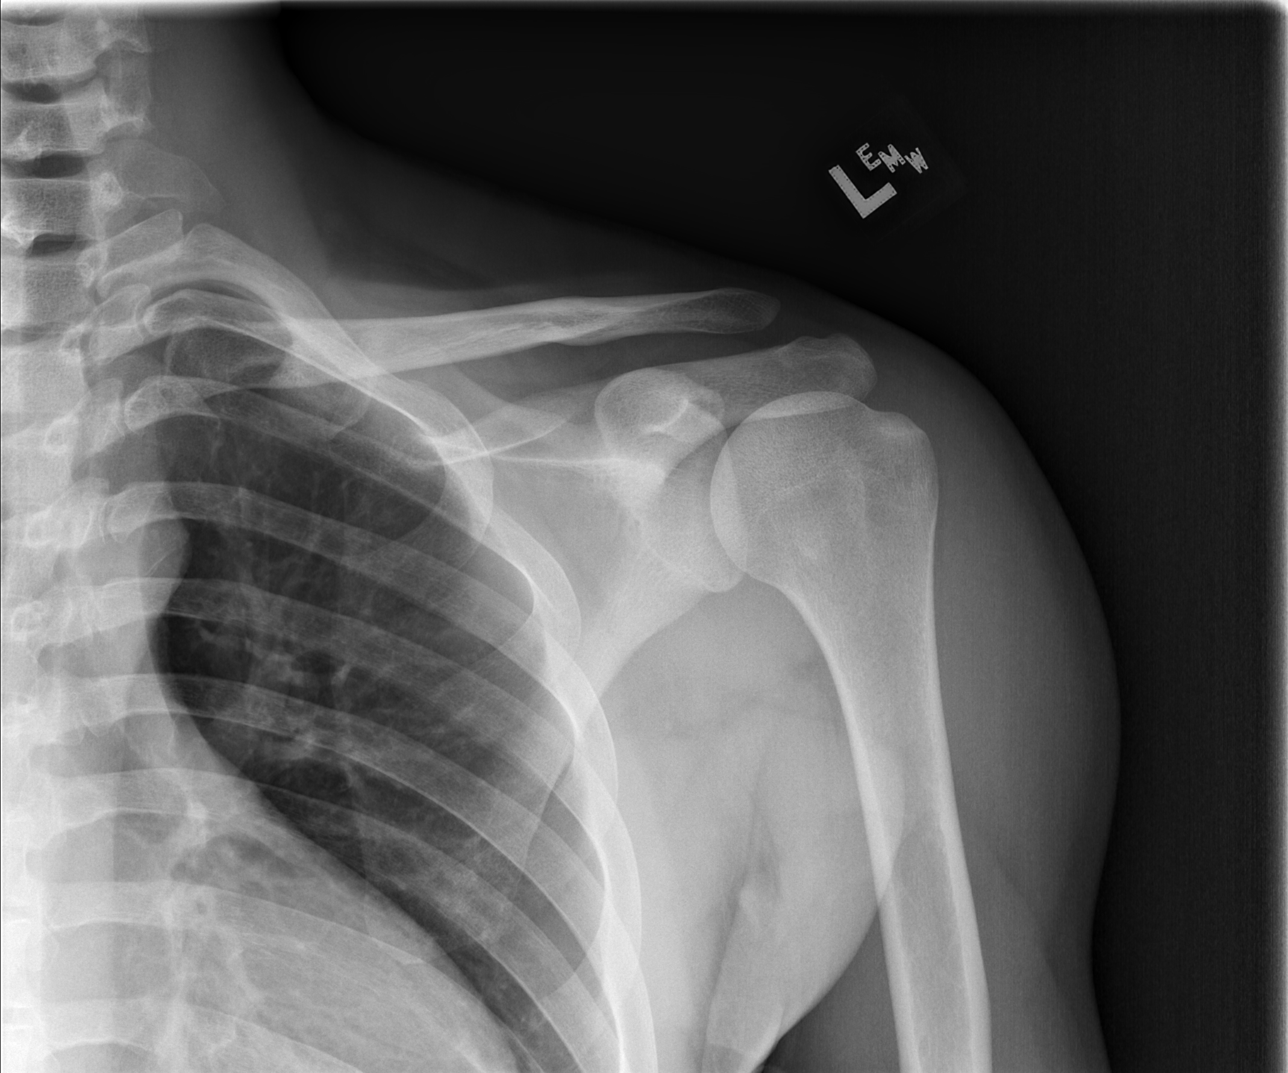

[w shoulder y view left]
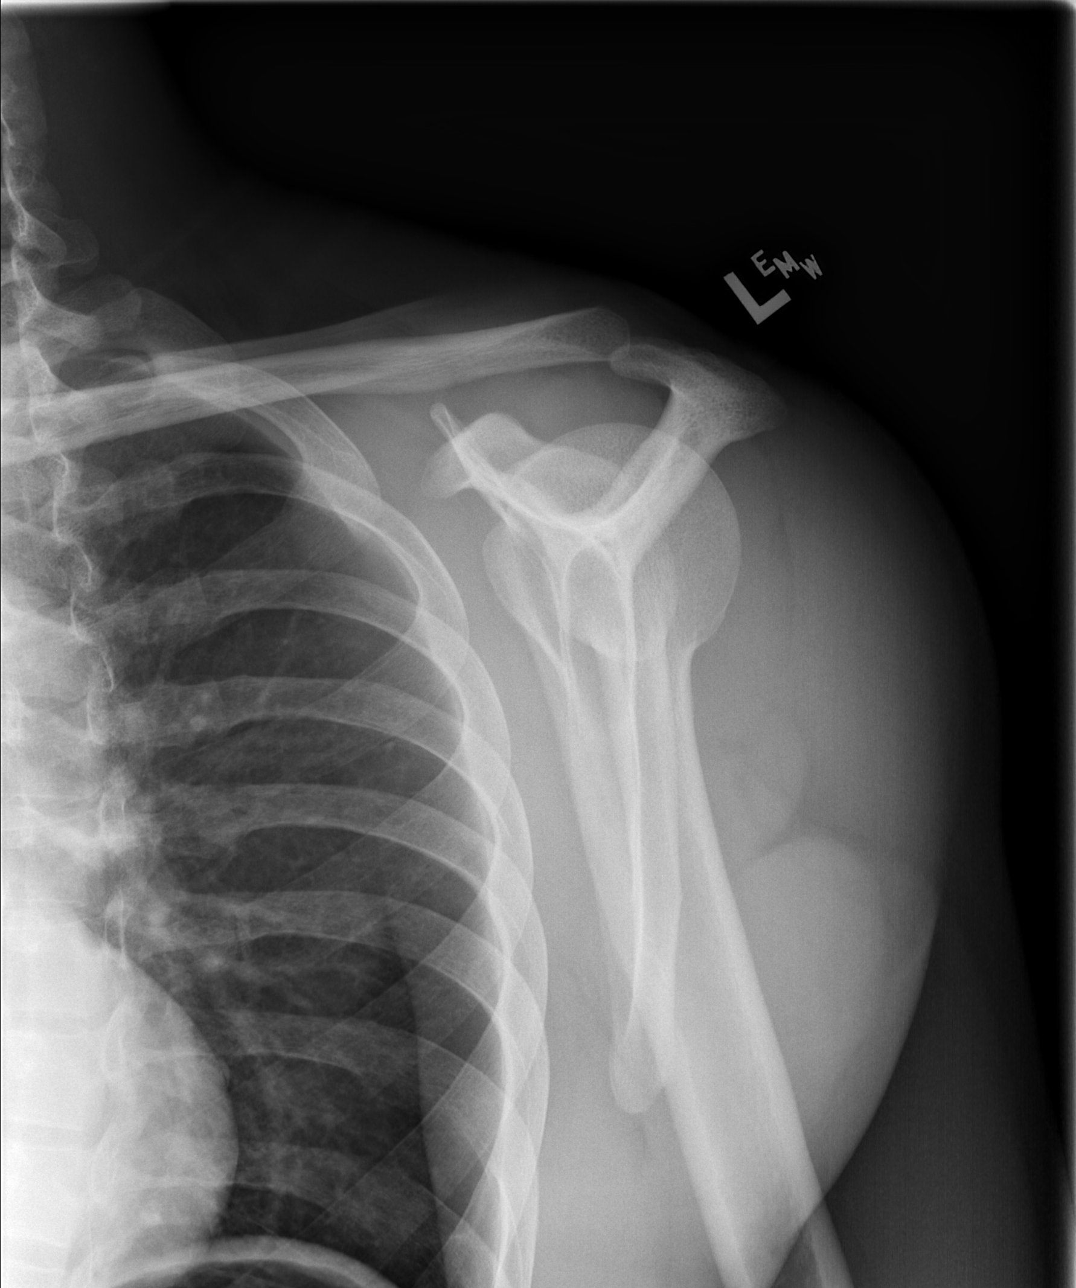

[x shoulder axillary left]
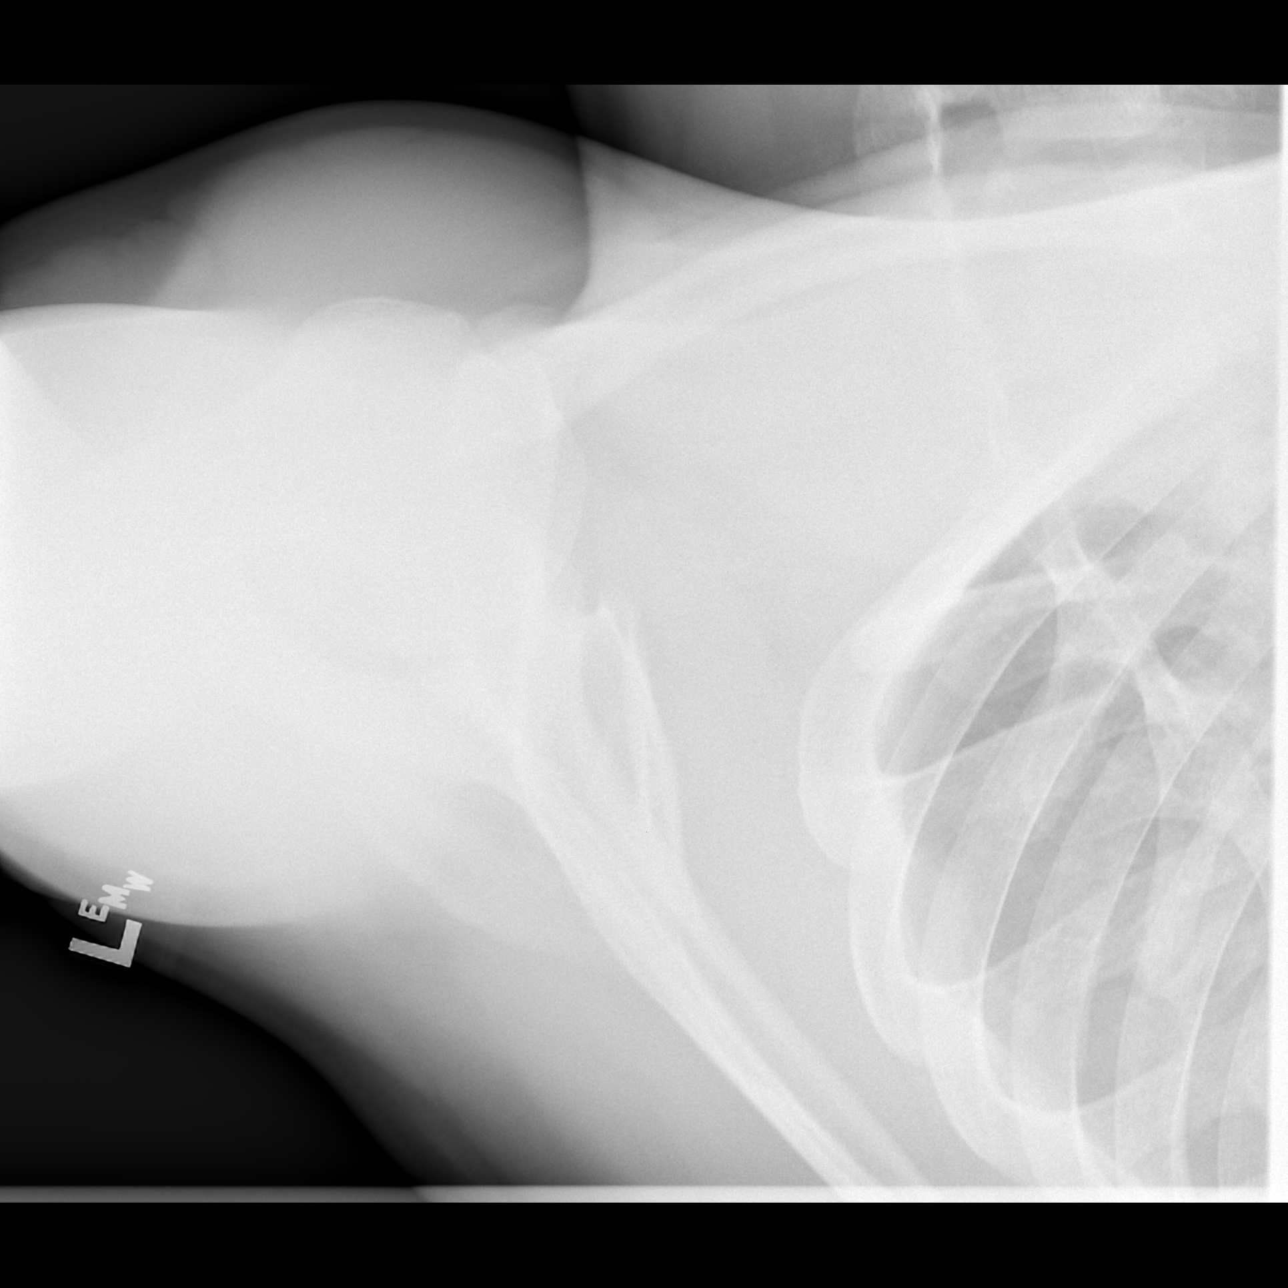

[3 of 3 positions shown; findings below may reference images not displayed]

FINDINGS: No acute bony or joint abnormality identified. No evidence of
fracture or dislocation.
IMPRESSION: No acute abnormality.
